# Patient Record
Sex: Male | Born: 1935 | Race: Black or African American | Hispanic: No | Smoking: Never smoker
Health system: Southern US, Community
[De-identification: ages and names within clinical notes are randomized; demographics above are authoritative.]

## PROBLEM LIST (undated history)

## (undated) DIAGNOSIS — A809 Acute poliomyelitis, unspecified: Secondary | ICD-10-CM

## (undated) DIAGNOSIS — N400 Enlarged prostate without lower urinary tract symptoms: Secondary | ICD-10-CM

## (undated) DIAGNOSIS — I10 Essential (primary) hypertension: Secondary | ICD-10-CM

## (undated) DIAGNOSIS — E785 Hyperlipidemia, unspecified: Secondary | ICD-10-CM

## (undated) HISTORY — PX: APPENDECTOMY: SHX54

## (undated) HISTORY — PX: TONSILLECTOMY: SUR1361

## (undated) HISTORY — PX: CHOLECYSTECTOMY: SHX55

---

## 2015-06-01 ENCOUNTER — Inpatient Hospital Stay (HOSPITAL_COMMUNITY): Payer: Medicare Other

## 2015-06-01 ENCOUNTER — Inpatient Hospital Stay (HOSPITAL_BASED_OUTPATIENT_CLINIC_OR_DEPARTMENT_OTHER)
Admission: EM | Admit: 2015-06-01 | Discharge: 2015-06-03 | DRG: 065 | Disposition: A | Discharge status: Home / Self Care | Payer: Medicare Other | Attending: Internal Medicine | Admitting: Internal Medicine

## 2015-06-01 ENCOUNTER — Emergency Department (HOSPITAL_COMMUNITY): Payer: Medicare Other

## 2015-06-01 ENCOUNTER — Encounter (HOSPITAL_BASED_OUTPATIENT_CLINIC_OR_DEPARTMENT_OTHER): Payer: Self-pay | Admitting: Emergency Medicine

## 2015-06-01 ENCOUNTER — Emergency Department (HOSPITAL_BASED_OUTPATIENT_CLINIC_OR_DEPARTMENT_OTHER): Payer: Medicare Other

## 2015-06-01 DIAGNOSIS — I10 Essential (primary) hypertension: Secondary | ICD-10-CM | POA: Diagnosis present

## 2015-06-01 DIAGNOSIS — E860 Dehydration: Secondary | ICD-10-CM | POA: Diagnosis present

## 2015-06-01 DIAGNOSIS — R269 Unspecified abnormalities of gait and mobility: Secondary | ICD-10-CM | POA: Diagnosis present

## 2015-06-01 DIAGNOSIS — N400 Enlarged prostate without lower urinary tract symptoms: Secondary | ICD-10-CM | POA: Diagnosis present

## 2015-06-01 DIAGNOSIS — E785 Hyperlipidemia, unspecified: Secondary | ICD-10-CM | POA: Diagnosis present

## 2015-06-01 DIAGNOSIS — N179 Acute kidney failure, unspecified: Secondary | ICD-10-CM | POA: Diagnosis present

## 2015-06-01 DIAGNOSIS — E876 Hypokalemia: Secondary | ICD-10-CM | POA: Diagnosis present

## 2015-06-01 DIAGNOSIS — I639 Cerebral infarction, unspecified: Principal | ICD-10-CM | POA: Diagnosis present

## 2015-06-01 DIAGNOSIS — Z79899 Other long term (current) drug therapy: Secondary | ICD-10-CM | POA: Diagnosis not present

## 2015-06-01 DIAGNOSIS — I638 Other cerebral infarction: Secondary | ICD-10-CM | POA: Diagnosis not present

## 2015-06-01 DIAGNOSIS — Z8612 Personal history of poliomyelitis: Secondary | ICD-10-CM

## 2015-06-01 DIAGNOSIS — H532 Diplopia: Secondary | ICD-10-CM | POA: Diagnosis present

## 2015-06-01 DIAGNOSIS — R42 Dizziness and giddiness: Secondary | ICD-10-CM | POA: Diagnosis not present

## 2015-06-01 HISTORY — DX: Hyperlipidemia, unspecified: E78.5

## 2015-06-01 HISTORY — DX: Benign prostatic hyperplasia without lower urinary tract symptoms: N40.0

## 2015-06-01 HISTORY — DX: Essential (primary) hypertension: I10

## 2015-06-01 HISTORY — DX: Acute poliomyelitis, unspecified: A80.9

## 2015-06-01 LAB — URINALYSIS, ROUTINE W REFLEX MICROSCOPIC
Bilirubin Urine: NEGATIVE
GLUCOSE, UA: NEGATIVE mg/dL
HGB URINE DIPSTICK: NEGATIVE
Ketones, ur: NEGATIVE mg/dL
Leukocytes, UA: NEGATIVE
Nitrite: NEGATIVE
Protein, ur: NEGATIVE mg/dL
Specific Gravity, Urine: 1.016 (ref 1.005–1.030)
pH: 7.5 (ref 5.0–8.0)

## 2015-06-01 LAB — BASIC METABOLIC PANEL
ANION GAP: 4 — AB (ref 5–15)
BUN: 25 mg/dL — ABNORMAL HIGH (ref 6–20)
CO2: 26 mmol/L (ref 22–32)
Calcium: 8.7 mg/dL — ABNORMAL LOW (ref 8.9–10.3)
Chloride: 107 mmol/L (ref 101–111)
Creatinine, Ser: 1.28 mg/dL — ABNORMAL HIGH (ref 0.61–1.24)
GFR calc Af Amer: 58 mL/min — ABNORMAL LOW (ref >60)
GFR, EST NON AFRICAN AMERICAN: 50 mL/min — AB (ref >60)
Glucose, Bld: 149 mg/dL — ABNORMAL HIGH (ref 65–99)
POTASSIUM: 3.2 mmol/L — AB (ref 3.5–5.1)
SODIUM: 137 mmol/L (ref 135–145)

## 2015-06-01 LAB — CBC
HCT: 41.9 % (ref 39.0–52.0)
Hemoglobin: 14.5 g/dL (ref 13.0–17.0)
MCH: 31.2 pg (ref 26.0–34.0)
MCHC: 34.6 g/dL (ref 30.0–36.0)
MCV: 90.1 fL (ref 78.0–100.0)
PLATELETS: 164 10*3/uL (ref 150–400)
RBC: 4.65 MIL/uL (ref 4.22–5.81)
RDW: 14.6 % (ref 11.5–15.5)
WBC: 5.6 10*3/uL (ref 4.0–10.5)

## 2015-06-01 LAB — DIFFERENTIAL
BASOS PCT: 0 %
Basophils Absolute: 0 10*3/uL (ref 0.0–0.1)
EOS ABS: 0 10*3/uL (ref 0.0–0.7)
EOS PCT: 1 %
LYMPHS ABS: 0.7 10*3/uL (ref 0.7–4.0)
Lymphocytes Relative: 12 %
Monocytes Absolute: 0.4 10*3/uL (ref 0.1–1.0)
Monocytes Relative: 7 %
Neutro Abs: 4.4 10*3/uL (ref 1.7–7.7)
Neutrophils Relative %: 80 %

## 2015-06-01 LAB — LIPID PANEL
CHOL/HDL RATIO: 2.6 ratio
CHOLESTEROL: 176 mg/dL (ref 0–200)
HDL: 67 mg/dL (ref >40)
LDL CALC: 95 mg/dL (ref 0–99)
TRIGLYCERIDES: 72 mg/dL (ref <150)
VLDL: 14 mg/dL (ref 0–40)

## 2015-06-01 LAB — RAPID URINE DRUG SCREEN, HOSP PERFORMED
Amphetamines: NOT DETECTED
BENZODIAZEPINES: NOT DETECTED
Barbiturates: NOT DETECTED
COCAINE: NOT DETECTED
Opiates: NOT DETECTED
Tetrahydrocannabinol: NOT DETECTED

## 2015-06-01 LAB — CBG MONITORING, ED: Glucose-Capillary: 135 mg/dL — ABNORMAL HIGH (ref 65–99)

## 2015-06-01 LAB — ETHANOL

## 2015-06-01 LAB — TROPONIN I: Troponin I: <0.03 ng/mL (ref <0.031)

## 2015-06-01 MED ORDER — ATORVASTATIN CALCIUM 80 MG PO TABS
80.0000 mg | ORAL_TABLET | Freq: Every day | ORAL | Status: DC
  Administered 2015-06-02: 80 mg via ORAL
  Filled 2015-06-01: qty 1

## 2015-06-01 MED ORDER — POTASSIUM CHLORIDE CRYS ER 20 MEQ PO TBCR
40.0000 meq | EXTENDED_RELEASE_TABLET | Freq: Once | ORAL | Status: AC
  Administered 2015-06-02: 40 meq via ORAL
  Filled 2015-06-01: qty 2

## 2015-06-01 MED ORDER — CLONIDINE HCL 0.1 MG PO TABS
0.1000 mg | ORAL_TABLET | Freq: Once | ORAL | Status: AC
  Administered 2015-06-01: 0.1 mg via ORAL
  Filled 2015-06-01: qty 1

## 2015-06-01 MED ORDER — ENOXAPARIN SODIUM 40 MG/0.4ML ~~LOC~~ SOLN
40.0000 mg | SUBCUTANEOUS | Status: DC
  Administered 2015-06-01 – 2015-06-02 (×2): 40 mg via SUBCUTANEOUS
  Filled 2015-06-01 (×2): qty 0.4

## 2015-06-01 MED ORDER — ASPIRIN 325 MG PO TABS: 325.0000 mg | ORAL_TABLET | Freq: Every day | ORAL | Status: DC

## 2015-06-01 MED ORDER — ASPIRIN 81 MG PO TBEC: 81.0000 mg | DELAYED_RELEASE_TABLET | Freq: Every day | ORAL | Status: DC

## 2015-06-01 MED ORDER — IOPAMIDOL (ISOVUE-370) INJECTION 76%
INTRAVENOUS | Status: AC
  Administered 2015-06-01: 50 mL
  Filled 2015-06-01: qty 50

## 2015-06-01 MED ORDER — LORAZEPAM 2 MG/ML IJ SOLN
1.0000 mg | Freq: Once | INTRAMUSCULAR | Status: AC
  Administered 2015-06-01: 1 mg via INTRAVENOUS
  Filled 2015-06-01: qty 1

## 2015-06-01 MED ORDER — SODIUM CHLORIDE 0.9% FLUSH
3.0000 mL | Freq: Two times a day (BID) | INTRAVENOUS | Status: DC
  Administered 2015-06-01 – 2015-06-03 (×3): 3 mL via INTRAVENOUS

## 2015-06-01 NOTE — ED Notes (Signed)
Patient transported to MRI 

## 2015-06-01 NOTE — Progress Notes (Signed)
MD notified about pt continuously having high BP.  MD stated BP can go up to 220/120.  Will continue to monitor.  Estanislado EmmsAshley Schwarz, RN

## 2015-06-01 NOTE — ED Notes (Signed)
Attempted report, RN on 61M made aware of patient's continued hypertension while being in ED and that patient has no orders for meds for elevated BP. RN on floor requested this RN check a manual pressure and contact MD caring for patient for further orders before bringing patient up.

## 2015-06-01 NOTE — ED Notes (Addendum)
This RN spoke with Dr. Beckie Saltsivet who was made aware of patient's bp systolically in the 200's, Dr. Beckie Saltsivet advised patient okay to go to 97M with current BP and that BP does not need treatment unless above 220/120. Cuida, RN on floor made aware.

## 2015-06-01 NOTE — ED Notes (Signed)
Pt reports that at 0600 he awoke  This am and got up to go to his meeting and drank grapefruit juice and started vomiting, he reports vomiting x 2, he started to got to meeting and noticed that he was seeing 2 cars and 2 lines on road instead of 1, [place]. He also reports feeling lightheaded, continued nausea

## 2015-06-01 NOTE — Consult Note (Addendum)
Requesting Physician: Dr. Dalene SeltzerSchlossman    Chief Complaint: stroke   HPI:                                                                                                                                         Theodore NunneryFrancis K Cerda Jr. is an 82 y.o. male who was showering at 5 AM. He states he was up early because he was going to be driving to this area. While in the shower, he reports he felt dizzy and not very well. He did become nauseated and did have emesis. He still continued to drive. At about 6:15 he noted diplopia and noted if he closed one eye it would resolve. Due to persistent diplopia he came to ED . While in ED he had a MRI which was positive for CVA. Currently he continues to have diplopia --more when looking out in distance and resolves with near vision.   Date last known well: Today Time last known well: Time: 05:00 tPA Given: No: out of window    Past Medical History  Diagnosis Date  . Hypertension   . Hyperlipidemia     Past Surgical History  Procedure Laterality Date  . Tonsillectomy    . Appendectomy      Family History  Problem Relation Age of Onset  . Hyperlipidemia Mother   . Hypertension Mother    Social History:  reports that he has never smoked. He does not have any smokeless tobacco history on file. He reports that he does not drink alcohol. His drug history is not on file.  Allergies: No Known Allergies  Medications:                                                                                                                           No current facility-administered medications for this encounter.   Current Outpatient Prescriptions  Medication Sig Dispense Refill  . cloNIDine (CATAPRES) 0.1 MG tablet Take 0.1 mg by mouth 2 (two) times daily.    . finasteride (PROSCAR) 5 MG tablet Take 5 mg by mouth daily.    . furosemide (LASIX) 20 MG tablet Take 20 mg by mouth.    . olmesartan (BENICAR) 40 MG tablet Take 40 mg by mouth daily.    . prazosin  (MINIPRESS) 2 MG capsule Take 2 mg by mouth at bedtime.    .Marland Kitchen  tamsulosin (FLOMAX) 0.4 MG CAPS capsule Take 0.4 mg by mouth.       ROS:                                                                                                                                       History obtained from the patient  General ROS: negative for - chills, fatigue, fever, night sweats, weight gain or weight loss Psychological ROS: negative for - behavioral disorder, hallucinations, memory difficulties, mood swings or suicidal ideation Ophthalmic ROS: negative for - blurry vision, double vision, eye pain or loss of vision ENT ROS: negative for - epistaxis, nasal discharge, oral lesions, sore throat, tinnitus or vertigo Allergy and Immunology ROS: negative for - hives or itchy/watery eyes Hematological and Lymphatic ROS: negative for - bleeding problems, bruising or swollen lymph nodes Endocrine ROS: negative for - galactorrhea, hair pattern changes, polydipsia/polyuria or temperature intolerance Respiratory ROS: negative for - cough, hemoptysis, shortness of breath or wheezing Cardiovascular ROS: negative for - chest pain, dyspnea on exertion, edema or irregular heartbeat Gastrointestinal ROS: negative for - abdominal pain, diarrhea, hematemesis, nausea/vomiting or stool incontinence Genito-Urinary ROS: negative for - dysuria, hematuria, incontinence or urinary frequency/urgency Musculoskeletal ROS: negative for - joint swelling or muscular weakness Neurological ROS: as noted in HPI Dermatological ROS: negative for rash and skin lesion changes  Neurologic Examination:                                                                                                      Blood pressure 186/95, pulse 61, temperature 98.1 F (36.7 C), temperature source Oral, resp. rate 16, weight 90.719 kg (200 lb), SpO2 97 %.  HEENT-  Normocephalic, no lesions, without obvious abnormality.  Normal external eye and conjunctiva.   Normal TM's bilaterally.  Normal auditory canals and external ears. Normal external nose, mucus membranes and septum.  Normal pharynx. Cardiovascular- S1, S2 normal, pulses palpable throughout   Lungs- chest clear, no wheezing, rales, normal symmetric air entry Abdomen- normal findings: bowel sounds normal Extremities- no edema Lymph-no adenopathy palpable Musculoskeletal-no joint tenderness, deformity or swelling Skin-warm and dry, no hyperpigmentation, vitiligo, or suspicious lesions  Neurological Examination Mental Status: Alert, oriented, thought content appropriate.  Speech fluent without evidence of aphasia.  Able to follow 3 step commands without difficulty. Cranial Nerves: II:  Visual fields grossly normal, pupils equal, round, reactive to light and accommodation III,IV, VI: ptosis not present, extra-ocular motions intact bilaterally--when looking distally his eyes are slightly disconjugate  V,VII: smile symmetric, facial light touch sensation normal bilaterally VIII: hearing normal bilaterally IX,X: uvula rises symmetrically XI: bilateral shoulder shrug XII: midline tongue extension Motor: Right : Upper extremity   5/5    Left:     Upper extremity   5/5  Lower extremity   1/5     Lower extremity   5/5 Right leg held in flexion contracture due to Polo --orbits right hand around left.  Tone and bulk:normal tone throughout; no atrophy noted Sensory: Pinprick and light touch intact throughout, bilaterally Deep Tendon Reflexes: 1+ and symmetric throughout Plantars: Right: downgoing   Left: downgoing Cerebellar: normal finger-to-nose and normal heel-to-shin test Gait: not tested       Lab Results: Basic Metabolic Panel:  Recent Labs Lab 06/01/15 0936  NA 137  K 3.2*  CL 107  CO2 26  GLUCOSE 149*  BUN 25*  CREATININE 1.28*  CALCIUM 8.7*    Liver Function Tests: No results for input(s): AST, ALT, ALKPHOS, BILITOT, PROT, ALBUMIN in the last 168 hours. No results  for input(s): LIPASE, AMYLASE in the last 168 hours. No results for input(s): AMMONIA in the last 168 hours.  CBC:  Recent Labs Lab 06/01/15 0936  WBC 5.6  NEUTROABS 4.4  HGB 14.5  HCT 41.9  MCV 90.1  PLT 164    Cardiac Enzymes:  Recent Labs Lab 06/01/15 0936  TROPONINI <0.03    Lipid Panel: No results for input(s): CHOL, TRIG, HDL, CHOLHDL, VLDL, LDLCALC in the last 168 hours.  CBG:  Recent Labs Lab 06/01/15 0927  GLUCAP 135*    Microbiology: No results found for this or any previous visit.  Coagulation Studies: No results for input(s): LABPROT, INR in the last 72 hours.  Imaging: Ct Head Wo Contrast  06/01/2015  CLINICAL DATA:  Diplopia and nausea since this morning. EXAM: CT HEAD WITHOUT CONTRAST TECHNIQUE: Contiguous axial images were obtained from the base of the skull through the vertex without intravenous contrast. COMPARISON:  None. FINDINGS: No mass lesion. No midline shift. No acute hemorrhage or hematoma. No extra-axial fluid collections. No evidence of acute infarction. Diffuse slight cerebellar atrophy and cerebral cortical atrophy. Brain parenchyma is otherwise normal. No bone abnormality. Visualized portion of the orbits is normal. IMPRESSION: No acute abnormality.  Atrophy as described. Electronically Signed   By: Francene Boyers M.D.   On: 06/01/2015 10:31   Mr Brain Wo Contrast  06/01/2015  CLINICAL DATA:  Diplopia and nausea beginning this morning. EXAM: MRI HEAD WITHOUT CONTRAST TECHNIQUE: Multiplanar, multiecho pulse sequences of the brain and surrounding structures were obtained without intravenous contrast. COMPARISON:  Head CT 06/01/2015 FINDINGS: There is a punctate 3 mm acute infarct in the posterior right pons. There is no evidence of intracranial hemorrhage, mass, midline shift, or extra-axial fluid collection. There is mild cerebral and cerebellar atrophy. T2 hyperintensities in the subcortical and periventricular cerebral white matter are  compatible with mild chronic small vessel ischemic disease. There is a chronic left central pontine infarct. Orbits are unremarkable. A right maxillary sinus mucous retention cyst is noted. The mastoid air cells are clear. Major intracranial vascular flow voids are preserved. IMPRESSION: 1. Punctate acute right pontine infarct. 2. Chronic left pontine infarct and chronic small vessel ischemic change in the cerebral white matter. Electronically Signed   By: Sebastian Ache M.D.   On: 06/01/2015 15:09       Assessment and plan discussed with with attending physician and they are in agreement.    Onalee Hua  [place] [place] [phone]  06/01/2015, 4:06 PM   Assessment: 82 y.o. male with acute right Pontine CVA. Likely due to small vessel disease.  Stroke Risk Factors - hyperlipidemia and hypertension  1. HgbA1c, fasting lipid panel 2. MRI of the brain without contrast 3. Frequent neuro checks 4. Echocardiogram 5. CT angiogram of the head and neck 6. Prophylactic therapy-Antiplatelet med: Aspirin - dose  PO or  PR 7. Risk factor modification 8. Telemetry monitoring 9. PT consult, OT consult, Speech consult 10. please page stroke NP  Or  PA  Or MD  M-F from 8am -4 pm starting 4/29 as this patient will be followed by the stroke team at this point.   You can look them up on [email or web address]   11. Permissive hypertension to 220/120   Ritta Slot, MD [facility] [phone]  If 7pm- 7am, please page neurology on call as listed in AMION.

## 2015-06-01 NOTE — ED Notes (Signed)
Dr. Amada JupiterKirkpatrick at bedside, aware of 5M's concern over patient's blood pressure, MD stated that patient's bp should be no higher than 220/120, MD made aware that patient's bp is 160/100, MD advised this RN to let floor know that he is okay with patient's blood pressure and that he does not want to treat it at this time.

## 2015-06-01 NOTE — Progress Notes (Signed)
Pt arrived to 5M16 room from ED.  Pt is alert and oriented. Pt states he is still having double vision, but denies numbness or tingling.  Safety measures in place.  Pt daughter, Darcella Gasmanonya Stall, called to check on pt.  Daughter will like MD to call tomorrow with updates about pt and test results.  Archie Pattenonya phone #[phone].  Festus Aloeia Harris is also pt daughter, phone # is [phone].  Will continue to monitor.   Estanislado EmmsAshley Schwarz, RN

## 2015-06-01 NOTE — H&P (Signed)
Date: 06/01/2015               Patient Name:  Theodore Tanner. MRN: [id number]  DOB: 09/29/1930 Age / Sex: 82 y.o., male   PCP: Pcp Not In System         Medical Service: Internal Medicine Teaching Service         Attending Physician: Dr. Earl Lagos, MD    First Contact: Dr. Loney Loh Pager: [phone]  Second Contact: Dr. Beckie Salts Pager: [phone]       After Hours (After 5p/  First Contact Pager: [phone]  weekends / holidays): Second Contact Pager: [phone]   Chief Complaint: "seeing double"  History of Present Illness: Patient is a 82 year old male with a past medical history of hypertension, hyperlipidemia, BPH, and polio (right lower extremity weakness) presenting to the hospital with a chief complaint of "seeing double" when he was driving earlier today. Patient reports having diplopia when using both of his eyes to see and states diplopia resolves when he only uses one eye. States he was on the highway when this happened and he kept on driving for 2 hours. When he finally reached his hotel he felt nauseated and vomited. Also reports feeling "woozy" and having problems with balance at that time. Denies having any focal weakness, numbness, or tingling. States this has never happened to him before and he was in his normal state of health until this morning. Denies having any prior history of seizures, stroke, or any other neurological disorder. Denies losing consciousness or having any speech difficulty.  Meds: Current Facility-Administered Medications  Medication Dose Route Frequency Provider Last Rate Last Dose  . [START ON 06/02/2015] aspirin EC tablet 81 mg  81 mg Oral Daily Carly J Rivet, MD      . aspirin tablet 325 mg  325 mg Oral Daily Su Hoff, MD      . Melene Muller ON 06/02/2015] atorvastatin (LIPITOR) tablet 80 mg  80 mg Oral q1800 Carly J Rivet, MD      . enoxaparin (LOVENOX) injection 40 mg  40 mg Subcutaneous Q24H Carly J Rivet, MD      . potassium chloride SA  (K-DUR,KLOR-CON) CR tablet 40 mEq  40 mEq Oral Once Carly J Rivet, MD      . sodium chloride flush (NS) 0.9 % injection 3 mL  3 mL Intravenous Q12H Carly Arlyce Harman, MD        Allergies: Allergies as of 06/01/2015  . (No Known Allergies)   Past Medical History  Diagnosis Date  . Hypertension   . Hyperlipidemia   . BPH (benign prostatic hyperplasia)   . Polio     RLE weakness   Past Surgical History  Procedure Laterality Date  . Tonsillectomy    . Appendectomy    . Cholecystectomy     Family History  Problem Relation Age of Onset  . Hyperlipidemia Mother   . Hypertension Mother   . Cancer Father     "Intestines", lungs  . Cancer Mother     Lung  . Diabetes Sister    Social History   Social History  . Marital Status: Widowed    Spouse Name: N/A  . Number of Children: N/A  . Years of Education: N/A   Occupational History  . Not on file.   Social History Main Topics  . Smoking status: Never Smoker   . Smokeless tobacco: Not on file  . Alcohol Use: No  . Drug Use: No  .  Sexual Activity: Not on file   Other Topics Concern  . Not on file   Social History Narrative    Review of Systems: Review of Systems  Constitutional: Negative for fever, chills and malaise/fatigue.  HENT: Negative for congestion and sore throat.   Eyes: Negative for pain and discharge.       Diplopia   Respiratory: Negative for cough, shortness of breath and wheezing.   Cardiovascular: Negative for chest pain, palpitations and leg swelling.  Gastrointestinal: Positive for nausea and vomiting. Negative for abdominal pain, diarrhea and constipation.  Genitourinary: Negative for dysuria.  Musculoskeletal: Negative for myalgias and back pain.  Skin: Negative for itching and rash.  Neurological: Positive for dizziness. Negative for sensory change, focal weakness and headaches.       Unsteady gait     Physical Exam: Blood pressure 223/91, pulse 59, temperature 98.6 F (37 C), temperature  source Oral, resp. rate 20, weight 200 lb (90.719 kg), SpO2 100 %. Physical Exam  Constitutional: He is oriented to person, place, and time. He appears well-developed and well-nourished. No distress.  HENT:  Head: Normocephalic and atraumatic.  Mouth/Throat: Oropharynx is clear and moist.  Eyes: EOM are normal. Pupils are equal, round, and reactive to light.  Neck: Neck supple. No tracheal deviation present.  Cardiovascular: Normal rate, regular rhythm and intact distal pulses.  Exam reveals no gallop and no friction rub.   No murmur heard. Pulmonary/Chest: Effort normal and breath sounds normal. No respiratory distress. He has no wheezes. He has no rales.  Abdominal: Soft. Bowel sounds are normal. He exhibits no distension. There is no tenderness.  Musculoskeletal: He exhibits no edema.  Neurological: He is alert and oriented to person, place, and time. No cranial nerve deficit. Coordination normal.  Strength 5/5 and sensation intact to light touch in bilateral upper and lower extremities.  Patient is able to see clearly when covering one eye at a time, however, has diplopia when engaging both his eyes.   Skin: Skin is warm and dry. No rash noted. He is not diaphoretic. No erythema.    Lab results: Basic Metabolic Panel:  Recent Labs  16/11/9602/28/17 0936  NA 137  K 3.2*  CL 107  CO2 26  GLUCOSE 149*  BUN 25*  CREATININE 1.28*  CALCIUM 8.7*   Liver Function Tests: No results for input(s): AST, ALT, ALKPHOS, BILITOT, PROT, ALBUMIN in the last 72 hours. No results for input(s): LIPASE, AMYLASE in the last 72 hours. No results for input(s): AMMONIA in the last 72 hours. CBC:  Recent Labs  06/01/15 0936  WBC 5.6  NEUTROABS 4.4  HGB 14.5  HCT 41.9  MCV 90.1  PLT 164   Cardiac Enzymes:  Recent Labs  06/01/15 0936  TROPONINI <0.03   BNP: No results for input(s): PROBNP in the last 72 hours. D-Dimer: No results for input(s): DDIMER in the last 72 hours. CBG:  Recent  Labs  06/01/15 0927  GLUCAP 135*   Hemoglobin A1C: No results for input(s): HGBA1C in the last 72 hours. Fasting Lipid Panel: No results for input(s): CHOL, HDL, LDLCALC, TRIG, CHOLHDL, LDLDIRECT in the last 72 hours. Thyroid Function Tests: No results for input(s): TSH, T4TOTAL, FREET4, T3FREE, THYROIDAB in the last 72 hours. Anemia Panel: No results for input(s): VITAMINB12, FOLATE, FERRITIN, TIBC, IRON, RETICCTPCT in the last 72 hours. Coagulation: No results for input(s): LABPROT, INR in the last 72 hours. Urine Drug Screen: Drugs of Abuse  No results found for: LABOPIA, COCAINSCRNUR,  LABBENZ, AMPHETMU, THCU, LABBARB  Alcohol Level:  Recent Labs  06/01/15 0927  ETH <5   Urinalysis: No results for input(s): COLORURINE, LABSPEC, PHURINE, GLUCOSEU, HGBUR, BILIRUBINUR, KETONESUR, PROTEINUR, UROBILINOGEN, NITRITE, LEUKOCYTESUR in the last 72 hours.  Invalid input(s): APPERANCEUR Misc. Labs:   Imaging results:  Ct Head Wo Contrast  06/01/2015  CLINICAL DATA:  Diplopia and nausea since this morning. EXAM: CT HEAD WITHOUT CONTRAST TECHNIQUE: Contiguous axial images were obtained from the base of the skull through the vertex without intravenous contrast. COMPARISON:  None. FINDINGS: No mass lesion. No midline shift. No acute hemorrhage or hematoma. No extra-axial fluid collections. No evidence of acute infarction. Diffuse slight cerebellar atrophy and cerebral cortical atrophy. Brain parenchyma is otherwise normal. No bone abnormality. Visualized portion of the orbits is normal. IMPRESSION: No acute abnormality.  Atrophy as described. Electronically Signed   By: Francene Boyers M.D.   On: 06/01/2015 10:31   Mr Brain Wo Contrast  06/01/2015  CLINICAL DATA:  Diplopia and nausea beginning this morning. EXAM: MRI HEAD WITHOUT CONTRAST TECHNIQUE: Multiplanar, multiecho pulse sequences of the brain and surrounding structures were obtained without intravenous contrast. COMPARISON:  Head CT  06/01/2015 FINDINGS: There is a punctate 3 mm acute infarct in the posterior right pons. There is no evidence of intracranial hemorrhage, mass, midline shift, or extra-axial fluid collection. There is mild cerebral and cerebellar atrophy. T2 hyperintensities in the subcortical and periventricular cerebral white matter are compatible with mild chronic small vessel ischemic disease. There is a chronic left central pontine infarct. Orbits are unremarkable. A right maxillary sinus mucous retention cyst is noted. The mastoid air cells are clear. Major intracranial vascular flow voids are preserved. IMPRESSION: 1. Punctate acute right pontine infarct. 2. Chronic left pontine infarct and chronic small vessel ischemic change in the cerebral white matter. Electronically Signed   By: Sebastian Ache M.D.   On: 06/01/2015 15:09    Other results: EKG: sinus rhythm, PVCs, PR prolongation   Assessment & Plan by Problem: Active Problems:   Acute ischemic stroke (HCC)  Acute right pontine infarct Patient is presenting with acute onset diplopia, dizzines, and gait instability. No prior history of stroke. CT of head was negative. MRI showing an acute R pontine infarct, chronic L pontine infarct, and small vessel ischemic change in the cerebral white matter.  -Neurology is on board, appreciate recs -Admit to tele -Aspirin 325 mg daily  -Lipitor 80 mg daily  -Frequent neuro checks  -CTA of head and neck pending -Hgb A1c pending -Echo pending -PT/OT/ speech consult   Elevated serum creatinine Cr 1.2, no baseline to compare. Likely pre-renal due to dehydration from vomiting.  -Repeat BMP in am   Hypokalemia K 3.2 on admission and repleted. -BMP in am  HTN -Hold home meds to allow permissive HTN (<220/120)  DVT/ PE ppx: Lovenox   Diet: heart healthy  Code: Full (confirmed with patient)  Dispo: Disposition is deferred at this time, awaiting improvement of current medical problems. Anticipated discharge  in approximately 1-2 day(s).   The patient does have a current PCP (Pcp Not In System) and does need an [facility] hospital follow-up appointment after discharge.  The patient does not have transportation limitations that hinder transportation to clinic appointments.  Signed: John Giovanni, MD 06/01/2015, 7:35 PM

## 2015-06-01 NOTE — ED Notes (Signed)
Dr. Earlie RavelingNarenda paged to this RN to make sure MD is aware of patient's bp and ensure that MD does not want any treatment of patient's BP prior to him going to floor. This RN awaiting return phone call from MD.

## 2015-06-01 NOTE — ED Notes (Signed)
PA Katrinka BlazingSmith advised patient may eat since he has passed his swallow screen.

## 2015-06-01 NOTE — ED Notes (Signed)
Cuida, RN on 67M aware of Dr. Chipper OmanKirkpatricks response regarding patient's bp and that he does not want to treat bp as long as it is under 220/120. RN on 67M acknowledged this and advised this RN that patient may come up at this time.

## 2015-06-01 NOTE — ED Notes (Signed)
PA Felicie Mornavid Smith with Neurology at bedside

## 2015-06-01 NOTE — ED Provider Notes (Signed)
CSN: [id number]     Arrival date & time 06/01/15  [id number] History   First MD Initiated Contact with Patient 06/01/15 [phone]     Chief Complaint  Patient presents with  . Diplopia     (Consider location/radiation/quality/duration/timing/severity/associated sxs/prior Treatment) HPI Patient reports that he was showering at 5 AM. He states he was up early because he was going to be driving to this area. While in the shower, he reports he felt dizzy and not very well. He does not specifically endorse a vertiginous type of dizziness. He denies associated headache. He reports he got nauseated and vomited a little bit at that time and then again after getting out of the shower he continues to feel nauseated and had some dry heaves. The dizziness persisted. It did not interfere with his gait. He denies it was focal weakness numbness tingling or extremity dysfunction. He also denies headache. He reports he got in his car started driving, once he got on the freeway which she reports was approximately 6:15 AM, he started seeing 2 of everything. Through trial and error he noted the double vision resolved when he closed either eye. He denies there was loss of vision. This double vision has persisted. He denies ever having had similar symptoms. He denies history of stroke or heart attack. He does report he was diagnosed with a blood clot in his right eye and had an injection by ophthalmology at [facility] several months ago. He reports he does have hypertension. He reports he is compliant with medications but has not taken his medications today. Past Medical History  Diagnosis Date  . Hypertension   . Hyperlipidemia    Past Surgical History  Procedure Laterality Date  . Tonsillectomy    . Appendectomy     History reviewed. No pertinent family history. Social History  Substance Use Topics  . Smoking status: Never Smoker   . Smokeless tobacco: None  . Alcohol Use: No    Review of Systems 10 Systems  reviewed and are negative for acute change except as noted in the HPI.    Allergies  Review of patient's allergies indicates no known allergies.  Home Medications   Prior to Admission medications   Medication Sig Start Date End Date Taking? Authorizing Provider  cloNIDine (CATAPRES) 0.1 MG tablet Take 0.1 mg by mouth 2 (two) times daily.   Yes Historical Provider, MD  finasteride (PROSCAR) 5 MG tablet Take 5 mg by mouth daily.   Yes Historical Provider, MD  furosemide (LASIX) 20 MG tablet Take 20 mg by mouth.   Yes Historical Provider, MD  olmesartan (BENICAR) 40 MG tablet Take 40 mg by mouth daily.   Yes Historical Provider, MD  prazosin (MINIPRESS) 2 MG capsule Take 2 mg by mouth at bedtime.   Yes Historical Provider, MD  tamsulosin (FLOMAX) 0.4 MG CAPS capsule Take 0.4 mg by mouth.   Yes Historical Provider, MD   BP 189/88 mmHg  Pulse 56  Temp(Src) 97.8 F (36.6 C) (Oral)  Resp 14  Wt 200 lb (90.719 kg)  SpO2 98% Physical Exam  Constitutional: He is oriented to person, place, and time. He appears well-developed and well-nourished. No distress.  HENT:  Head: Normocephalic and atraumatic.  Right Ear: External ear normal.  Left Ear: External ear normal.  Nose: Nose normal.  Mouth/Throat: Oropharynx is clear and moist.  Eyes: Pupils are equal, round, and reactive to light.  Neck: Neck supple.  Cardiovascular: Normal rate, regular rhythm, normal heart sounds  and intact distal pulses.   Pulmonary/Chest: Effort normal and breath sounds normal.  Abdominal: Soft. Bowel sounds are normal. He exhibits no distension. There is no tenderness.  Musculoskeletal: Normal range of motion. He exhibits no edema.  Neurological: He is alert and oriented to person, place, and time. He has normal strength. Coordination normal. GCS eye subscore is 4. GCS verbal subscore is 5. GCS motor subscore is 6.  Skin: Skin is warm, dry and intact.  Psychiatric: He has a normal mood and affect.    ED Course   Procedures (including critical care time) Labs Review Labs Reviewed  BASIC METABOLIC PANEL - Abnormal; Notable for the following:    Potassium 3.2 (*)    Glucose, Bld 149 (*)    BUN 25 (*)    Creatinine, Ser 1.28 (*)    Calcium 8.7 (*)    GFR calc non Af Amer 50 (*)    GFR calc Af Amer 58 (*)    Anion gap 4 (*)    All other components within normal limits  CBG MONITORING, ED - Abnormal; Notable for the following:    Glucose-Capillary 135 (*)    All other components within normal limits  CBC  DIFFERENTIAL  ETHANOL  PROTIME-INR  APTT  URINE RAPID DRUG SCREEN, HOSP PERFORMED  URINALYSIS, ROUTINE W REFLEX MICROSCOPIC (NOT AT [facility])  TROPONIN I  CBG MONITORING, ED    Imaging Review Ct Head Wo Contrast  06/01/2015  CLINICAL DATA:  Diplopia and nausea since this morning. EXAM: CT HEAD WITHOUT CONTRAST TECHNIQUE: Contiguous axial images were obtained from the base of the skull through the vertex without intravenous contrast. COMPARISON:  None. FINDINGS: No mass lesion. No midline shift. No acute hemorrhage or hematoma. No extra-axial fluid collections. No evidence of acute infarction. Diffuse slight cerebellar atrophy and cerebral cortical atrophy. Brain parenchyma is otherwise normal. No bone abnormality. Visualized portion of the orbits is normal. IMPRESSION: No acute abnormality.  Atrophy as described. Electronically Signed   By: Francene Boyers M.D.   On: 06/01/2015 10:31   I have personally reviewed and evaluated these images and lab results as part of my medical decision-making.   EKG Interpretation   Date/Time:  Friday June 01 2015 09:27:53 EDT Ventricular Rate:  69 PR Interval:  221 QRS Duration: 115 QT Interval:  410 QTC Calculation: 439 R Axis:   -41 Text Interpretation:  Sinus rhythm Multiple ventricular premature  complexes Prolonged PR interval LVH with IVCD, LAD and secondary repol  abnrm Confirmed by Donnald Garre, MD, Lebron Conners [phone]) on 06/01/2015 10:34:07 AM      Consult: Reviewed to Dr. Amada Jupiter of neurology. Patient will need MRI. Plan to transfer to [facility] emergency department for neurology consultation and MRI. Consult: Reviewed with Dr. Tyrone Apple of emergency department for EMTALA transfer. MDM   Final diagnoses:  Dizziness  Essential hypertension  Binocular vision disorder with diplopia   Patient had onset first of dizziness and nausea reportedly approximately 5 AM. Subsequently diplopia at 6:15 AM. Patient is alert and appropriate. Cognitive function is normal. There is no focal motor deficit. Patient will be transferred to [facility] for neurology consultation and MRI.    Arby Barrette, MD 06/01/15 1034

## 2015-06-01 NOTE — ED Notes (Signed)
MD at bedside. 

## 2015-06-02 DIAGNOSIS — E876 Hypokalemia: Secondary | ICD-10-CM

## 2015-06-02 DIAGNOSIS — R7989 Other specified abnormal findings of blood chemistry: Secondary | ICD-10-CM

## 2015-06-02 DIAGNOSIS — N179 Acute kidney failure, unspecified: Secondary | ICD-10-CM

## 2015-06-02 DIAGNOSIS — I639 Cerebral infarction, unspecified: Principal | ICD-10-CM

## 2015-06-02 DIAGNOSIS — I10 Essential (primary) hypertension: Secondary | ICD-10-CM

## 2015-06-02 DIAGNOSIS — I638 Other cerebral infarction: Secondary | ICD-10-CM

## 2015-06-02 LAB — BASIC METABOLIC PANEL
Anion gap: 10 (ref 5–15)
BUN: 13 mg/dL (ref 6–20)
CALCIUM: 8.7 mg/dL — AB (ref 8.9–10.3)
CO2: 28 mmol/L (ref 22–32)
CREATININE: 1.02 mg/dL (ref 0.61–1.24)
Chloride: 106 mmol/L (ref 101–111)
GFR calc non Af Amer: >60 mL/min (ref >60)
GLUCOSE: 101 mg/dL — AB (ref 65–99)
Potassium: 3.4 mmol/L — ABNORMAL LOW (ref 3.5–5.1)
Sodium: 144 mmol/L (ref 135–145)

## 2015-06-02 MED ORDER — CLOPIDOGREL BISULFATE 75 MG PO TABS
75.0000 mg | ORAL_TABLET | Freq: Every day | ORAL | Status: DC
  Administered 2015-06-03: 75 mg via ORAL
  Filled 2015-06-02: qty 1

## 2015-06-02 MED ORDER — CLOPIDOGREL BISULFATE 75 MG PO TABS: 75.0000 mg | ORAL_TABLET | Freq: Every day | ORAL | Status: DC

## 2015-06-02 MED ORDER — HYDROCODONE-ACETAMINOPHEN 5-325 MG PO TABS: 1.0000 | ORAL_TABLET | ORAL | Status: DC | PRN

## 2015-06-02 MED ORDER — HYDROCODONE-ACETAMINOPHEN 5-325 MG PO TABS
2.0000 | ORAL_TABLET | Freq: Once | ORAL | Status: DC
  Filled 2015-06-02: qty 2

## 2015-06-02 NOTE — Progress Notes (Signed)
Based on progress note by Theodosia PalingAshley M Schwartz 06/01/15 2218 we are to notify only for systolic BP over 161220 and diastolic over 120 and not to alert attending for BP's systolic over 160 or diastolic over 100 per the orders for this patient. Patients BP  06/02/15 2214 was 209/93 will continue to monitor.

## 2015-06-02 NOTE — Progress Notes (Addendum)
Subjective: No acute overnight events. Patient states his vision is better now. He still has intermittent diplopia but only when looking at objects that are far away. No nausea or vomiting. He is eating well.    Objective: Vital signs in last 24 hours: Filed Vitals:   06/02/15 0400 06/02/15 0600 06/02/15 0800 06/02/15 0937  BP: 190/93 159/80 161/115 192/94  Pulse: 56 55 70 58  Temp:   98.1 F (36.7 C) 98.4 F (36.9 C)  TempSrc:   Oral Oral  Resp: 18 18 20 18   Weight:      SpO2: 99% 99% 99% 100%   Weight change:  No intake or output data in the 24 hours ending 06/02/15 1108  Physical Exam: Constitutional: He is oriented to person, place, and time. He appears well-developed and well-nourished. No distress.   Mouth/Throat: Oropharynx is clear and moist.  Eyes: EOM are normal.   Cardiovascular: Normal rate, regular rhythm and intact distal pulses. Exam reveals no gallop and no friction rub.  No murmur heard. Pulmonary/Chest: Effort normal and breath sounds normal. No respiratory distress. He has no wheezes. He has no rales.  Abdominal: Soft. Bowel sounds are normal. He exhibits no distension. There is no tenderness.  Musculoskeletal: He exhibits no edema.  Neurological: He is alert and oriented to person, place, and time. No cranial nerve deficit. Coordination normal.  Strength 5/5 and sensation intact to light touch in bilateral upper and lower extremities.  He was able to see clearly on exam when asked to see objects nearby.    Skin: Skin is warm and dry. No rash noted. He is not diaphoretic. No erythema.   Lab Results: Basic Metabolic Panel:  Recent Labs Lab 06/01/15 0936 06/02/15 0445  NA 137 144  K 3.2* 3.4*  CL 107 106  CO2 26 28  GLUCOSE 149* 101*  BUN 25* 13  CREATININE 1.28* 1.02  CALCIUM 8.7* 8.7*   Liver Function Tests: No results for input(s): AST, ALT, ALKPHOS, BILITOT, PROT, ALBUMIN in the last 168 hours. No results for input(s): LIPASE, AMYLASE  in the last 168 hours. No results for input(s): AMMONIA in the last 168 hours. CBC:  Recent Labs Lab 06/01/15 0936  WBC 5.6  NEUTROABS 4.4  HGB 14.5  HCT 41.9  MCV 90.1  PLT 164   Cardiac Enzymes:  Recent Labs Lab 06/01/15 0936  TROPONINI <0.03   BNP: No results for input(s): PROBNP in the last 168 hours. D-Dimer: No results for input(s): DDIMER in the last 168 hours. CBG:  Recent Labs Lab 06/01/15 0927  GLUCAP 135*   Hemoglobin A1C: No results for input(s): HGBA1C in the last 168 hours. Fasting Lipid Panel:  Recent Labs Lab 06/01/15 2104  CHOL 176  HDL 67  LDLCALC 95  TRIG 72  CHOLHDL 2.6   Thyroid Function Tests: No results for input(s): TSH, T4TOTAL, FREET4, T3FREE, THYROIDAB in the last 168 hours. Coagulation: No results for input(s): LABPROT, INR in the last 168 hours. Anemia Panel: No results for input(s): VITAMINB12, FOLATE, FERRITIN, TIBC, IRON, RETICCTPCT in the last 168 hours. Urine Drug Screen: Drugs of Abuse     Component Value Date/Time   LABOPIA NONE DETECTED 06/01/2015 2121   COCAINSCRNUR NONE DETECTED 06/01/2015 2121   LABBENZ NONE DETECTED 06/01/2015 2121   AMPHETMU NONE DETECTED 06/01/2015 2121   THCU NONE DETECTED 06/01/2015 2121   LABBARB NONE DETECTED 06/01/2015 2121    Alcohol Level:  Recent Labs Lab 06/01/15 0927  ETH <5  Urinalysis:  Recent Labs Lab 06/01/15 2122  COLORURINE YELLOW  LABSPEC 1.016  PHURINE 7.5  GLUCOSEU NEGATIVE  HGBUR NEGATIVE  BILIRUBINUR NEGATIVE  KETONESUR NEGATIVE  PROTEINUR NEGATIVE  NITRITE NEGATIVE  LEUKOCYTESUR NEGATIVE   Misc. Labs:   Micro Results: No results found for this or any previous visit (from the past 240 hour(s)). Studies/Results: Ct Angio Head W/cm &/or Wo Cm  06/01/2015  CLINICAL DATA:  Stroke.  History of hypertension and hyperlipidemia. EXAM: CT ANGIOGRAPHY HEAD AND NECK TECHNIQUE: Multidetector CT imaging of the head and neck was performed using the standard  protocol during bolus administration of intravenous contrast. Multiplanar CT image reconstructions and MIPs were obtained to evaluate the vascular anatomy. Carotid stenosis measurements (when applicable) are obtained utilizing NASCET criteria, using the distal internal carotid diameter as the denominator. CONTRAST:  50 cc Isovue 370 COMPARISON:  MRI of the brain June 01, 2015 at 1424 hours FINDINGS: CT HEAD INTRACRANIAL CONTENTS: The ventricles and sulci are normal for age. No intraparenchymal hemorrhage, mass effect nor midline shift. Patchy supratentorial white matter hypodensities are less than expected for patient's age and though non-specific likely represent chronic small vessel ischemic disease. No abnormal intracranial enhancement. No acute large vascular territory infarcts. No abnormal extra-axial fluid collections. Basal cisterns are patent. Mild calcific atherosclerosis of the carotid siphons. ORBITS: The included ocular globes and orbital contents are non-suspicious. SINUSES: The mastoid aircells and included paranasal sinuses are well-aerated. SKULL/SOFT TISSUES: No skull fracture. No significant soft tissue swelling. CTA NECK Aortic arch: Normal appearance of the thoracic arch, normal branch pattern. Mild calcific atherosclerosis of the aortic arch. The origins of the innominate, left Common carotid artery and subclavian artery are widely patent. Right carotid system: Common carotid artery is widely patent, coursing in a straight line fashion. Normal appearance of the carotid bifurcation without hemodynamically significant stenosis by NASCET criteria. Minimal eccentric calcific atherosclerosis. Normal appearance of the included internal carotid artery. Left carotid system: Common carotid artery is widely patent, coursing in a straight line fashion. Normal appearance of the carotid bifurcation without hemodynamically significant stenosis by NASCET criteria. Minimal eccentric calcific atherosclerosis.  Normal appearance of the included internal carotid artery. Vertebral arteries:Left vertebral artery is dominant. Mild extrinsic deformity due to degenerative cervical spine. Patent vertebral arteries. Skeleton: No acute osseous process though bone windows have not been submitted. Multilevel moderate to severe degenerative discs and facet arthropathy with resultant mild canal stenosis C3-4, C4-5, C5-6 and C6-7. Severe LEFT C3-4, bilateral C4-5, RIGHT greater than LEFT C5-6 neural foraminal narrowing. Other neck: Soft tissues of the neck are non-acute though, not tailored for evaluation. Mild apical bullous changes on the RIGHT. Calcified subcarinal lymph nodes. Heterogeneous RIGHT thyroid lobe without dominant nodule, status post LEFT thyroidectomy. Mildly atrophic RIGHT submandibular gland. CTA HEAD Anterior circulation: Normal appearance of the cervical internal carotid arteries, petrous, cavernous and supra clinoid internal carotid arteries. Widely patent anterior communicating artery. Moderate stenosis proximal RIGHT M2 segment. Otherwise normal appearance of the anterior and middle cerebral arteries. Posterior circulation: Normal appearance of the vertebral arteries, vertebrobasilar junction and basilar artery, as well as main branch vessels. Robust bilateral posterior communicating arteries contribute to posterior circulation. Moderate stenosis proximal LEFT P2 segment. Mild luminal irregularity of the intracranial vessels without acute large vessel occlusion, scratch or aneurysm. 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 IMPRESSION: CT HEAD:  Negative CT head with and without contrast for age. CTA NECK: Atherosclerosis without hemodynamically significant stenosis or acute vascular process. CTA HEAD: No emergent  large vessel occlusion. 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 Moderate stenosis proximal RIGHT M2 segment and proximal LEFT P2 segment. Mild luminal irregularity of the intracranial  vessels compatible with atherosclerosis. Electronically Signed   By: Awilda Metro M.D.   On: 06/01/2015 23:56   Ct Head Wo Contrast  06/01/2015  CLINICAL DATA:  Diplopia and nausea since this morning. EXAM: CT HEAD WITHOUT CONTRAST TECHNIQUE: Contiguous axial images were obtained from the base of the skull through the vertex without intravenous contrast. COMPARISON:  None. FINDINGS: No mass lesion. No midline shift. No acute hemorrhage or hematoma. No extra-axial fluid collections. No evidence of acute infarction. Diffuse slight cerebellar atrophy and cerebral cortical atrophy. Brain parenchyma is otherwise normal. No bone abnormality. Visualized portion of the orbits is normal. IMPRESSION: No acute abnormality.  Atrophy as described. Electronically Signed   By: Francene Boyers M.D.   On: 06/01/2015 10:31   Ct Angio Neck W/cm &/or Wo/cm  06/01/2015  CLINICAL DATA:  Stroke.  History of hypertension and hyperlipidemia. EXAM: CT ANGIOGRAPHY HEAD AND NECK TECHNIQUE: Multidetector CT imaging of the head and neck was performed using the standard protocol during bolus administration of intravenous contrast. Multiplanar CT image reconstructions and MIPs were obtained to evaluate the vascular anatomy. Carotid stenosis measurements (when applicable) are obtained utilizing NASCET criteria, using the distal internal carotid diameter as the denominator. CONTRAST:  50 cc Isovue 370 COMPARISON:  MRI of the brain June 01, 2015 at 1424 hours FINDINGS: CT HEAD INTRACRANIAL CONTENTS: The ventricles and sulci are normal for age. No intraparenchymal hemorrhage, mass effect nor midline shift. Patchy supratentorial white matter hypodensities are less than expected for patient's age and though non-specific likely represent chronic small vessel ischemic disease. No abnormal intracranial enhancement. No acute large vascular territory infarcts. No abnormal extra-axial fluid collections. Basal cisterns are patent. Mild calcific  atherosclerosis of the carotid siphons. ORBITS: The included ocular globes and orbital contents are non-suspicious. SINUSES: The mastoid aircells and included paranasal sinuses are well-aerated. SKULL/SOFT TISSUES: No skull fracture. No significant soft tissue swelling. CTA NECK Aortic arch: Normal appearance of the thoracic arch, normal branch pattern. Mild calcific atherosclerosis of the aortic arch. The origins of the innominate, left Common carotid artery and subclavian artery are widely patent. Right carotid system: Common carotid artery is widely patent, coursing in a straight line fashion. Normal appearance of the carotid bifurcation without hemodynamically significant stenosis by NASCET criteria. Minimal eccentric calcific atherosclerosis. Normal appearance of the included internal carotid artery. Left carotid system: Common carotid artery is widely patent, coursing in a straight line fashion. Normal appearance of the carotid bifurcation without hemodynamically significant stenosis by NASCET criteria. Minimal eccentric calcific atherosclerosis. Normal appearance of the included internal carotid artery. Vertebral arteries:Left vertebral artery is dominant. Mild extrinsic deformity due to degenerative cervical spine. Patent vertebral arteries. Skeleton: No acute osseous process though bone windows have not been submitted. Multilevel moderate to severe degenerative discs and facet arthropathy with resultant mild canal stenosis C3-4, C4-5, C5-6 and C6-7. Severe LEFT C3-4, bilateral C4-5, RIGHT greater than LEFT C5-6 neural foraminal narrowing. Other neck: Soft tissues of the neck are non-acute though, not tailored for evaluation. Mild apical bullous changes on the RIGHT. Calcified subcarinal lymph nodes. Heterogeneous RIGHT thyroid lobe without dominant nodule, status post LEFT thyroidectomy. Mildly atrophic RIGHT submandibular gland. CTA HEAD Anterior circulation: Normal appearance of the cervical internal  carotid arteries, petrous, cavernous and supra clinoid internal carotid arteries. Widely patent anterior communicating artery. Moderate stenosis proximal  RIGHT M2 segment. Otherwise normal appearance of the anterior and middle cerebral arteries. Posterior circulation: Normal appearance of the vertebral arteries, vertebrobasilar junction and basilar artery, as well as main branch vessels. Robust bilateral posterior communicating arteries contribute to posterior circulation. Moderate stenosis proximal LEFT P2 segment. Mild luminal irregularity of the intracranial vessels without acute large vessel occlusion, scratch or aneurysm. 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 IMPRESSION: CT HEAD:  Negative CT head with and without contrast for age. CTA NECK: Atherosclerosis without hemodynamically significant stenosis or acute vascular process. CTA HEAD: No emergent large vessel occlusion. 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 Moderate stenosis proximal RIGHT M2 segment and proximal LEFT P2 segment. Mild luminal irregularity of the intracranial vessels compatible with atherosclerosis. Electronically Signed   By: Awilda Metro M.D.   On: 06/01/2015 23:56   Mr Brain Wo Contrast  06/01/2015  CLINICAL DATA:  Diplopia and nausea beginning this morning. EXAM: MRI HEAD WITHOUT CONTRAST TECHNIQUE: Multiplanar, multiecho pulse sequences of the brain and surrounding structures were obtained without intravenous contrast. COMPARISON:  Head CT 06/01/2015 FINDINGS: There is a punctate 3 mm acute infarct in the posterior right pons. There is no evidence of intracranial hemorrhage, mass, midline shift, or extra-axial fluid collection. There is mild cerebral and cerebellar atrophy. T2 hyperintensities in the subcortical and periventricular cerebral white matter are compatible with mild chronic small vessel ischemic disease. There is a chronic left central pontine infarct. Orbits are unremarkable. A right maxillary sinus  mucous retention cyst is noted. The mastoid air cells are clear. Major intracranial vascular flow voids are preserved. IMPRESSION: 1. Punctate acute right pontine infarct. 2. Chronic left pontine infarct and chronic small vessel ischemic change in the cerebral white matter. Electronically Signed   By: Sebastian Ache M.D.   On: 06/01/2015 15:09   Medications: I have reviewed the patient's current medications. Scheduled Meds: . atorvastatin  80 mg Oral q1800  . [START ON 06/03/2015] clopidogrel  75 mg Oral Daily  . enoxaparin (LOVENOX) injection  40 mg Subcutaneous Q24H  . potassium chloride  40 mEq Oral Once  . sodium chloride flush  3 mL Intravenous Q12H   Continuous Infusions:  PRN Meds:. Assessment/Plan: Active Problems:   Acute ischemic stroke (HCC)  Acute right pontine infarct He still has intermittent diplopia but only when looking at objects that are far away. Not having any nausea/ vomtiing and tolerating PO intake well. CTA of head showing tandem severe stenosis distal LEFT V4 segment. Moderate stenosis proximal RIGHT M2 segment and proximal LEFT P2 segment. Mild luminal irregularity of the intracranial vessels compatible with atherosclerosis. CTA of neck showing atherosclerosis but no significant stenosis or acute vascular process. Lipid panel showing cholesterol 176, HDL 67, and LDL 95.  -Neurology is on board, appreciate recs -Monitor on telemetry  -Aspirin 325 mg daily  -Lipitor 80 mg daily  -Frequent neuro checks  -Hgb A1c pending -Echo pending -PT/OT/ speech consult   Elevated serum creatinine Cr 1.2, no baseline to compare. Likely pre-renal due to dehydration from vomiting. Creatinine improved to 1.0 today.  -BMP in a.m.  Hypokalemia K 3.4 today and repleted.  -BMP in am  HTN -Hold home BP meds to allow permissive HTN (<220/120) -Plan is to discontinue his home medication Clonidine to prevent rebound HTN. He will be started on HCTZ prior to discharge.   DVT/ PE  ppx: Lovenox   Diet: heart healthy  Code: Full  Dispo: Disposition is deferred at this time, awaiting improvement of  current medical problems.  Anticipated discharge in approximately 1-2 day(s).   The patient does have a current PCP (Pcp Not In System) and does need an [facility] hospital follow-up appointment after discharge.  The patient does not have transportation limitations that hinder transportation to clinic appointments.  .Services Needed at time of discharge: Y = Yes, Blank = No PT:   OT:   RN:   Equipment:   Other:     LOS: 1 day   John Giovanni, MD 06/02/2015, 11:08 AM

## 2015-06-02 NOTE — Progress Notes (Signed)
OT Cancellation Note  Patient Details Name: Theodore NunneryFrancis K Zertuche Jr. MRN: [id number] DOB: [birth year]   Cancelled Treatment:    Reason Eval/Treat Not Completed: Patient at procedure or test/ unavailable (Will continue to follow.)  Evern BioMayberry, Shantara Goosby Lynn 06/02/2015, 1:40 PM

## 2015-06-02 NOTE — Progress Notes (Signed)
  Date: 06/02/2015  Patient name: Theodore NunneryFrancis K Dunn Jr.  Medical record number: [id number]  Date of birth: August 12, 1930   I have seen and evaluated Theodore NunneryFrancis K Davalos Jr. and discussed their care with the Residency Team. In brief, patient is a 82 y/o male with PMH of  HTN, HLD, BPH, polio who p/w diplopia * 1 day. [place] when he noted sudden onset diplopia while on the highway and needed to close one eye to continue driving. He had complained of nausea and 1 episode of emesis prior to leaving his house and on arriving in [place] noted lightheadedness and recurrent n/v. No hematemesis. No fevers/chills, no HA, no syncope, no focal weakness. He did note that he felt unsteady while ambulating.He has persistent diplopia today.  PMHx, Fam Hx, and/or Soc Hx : as per residenta dmit note  Filed Vitals:   06/02/15 0800 06/02/15 0937  BP: 161/115 192/94  Pulse: 70 58  Temp: 98.1 F (36.7 C) 98.4 F (36.9 C)  Resp: 20 18   Gen: AAO*3, NAD CVS: RRR, normal heart sounds LUngs: CTA b/l Abd: soft, non tender, BS + Ext: no edema Neuro: Power 5/5 b/l UE, LE, sensation intact, persistent diplopia + but improving  Assessment and Plan: I have seen and evaluated the patient as outlined above. I agree with the formulated Assessment and Plan as detailed in the residents' note, with the following changes:   1. Acute pontine CVA: - Neuro f/u appreciated - Stroke team to f/u today - Will need PT/OT eval - Will f/u 2 D ECHO - c/w lipitor. Would d/c asa and start plavix as patient was on baby asa at home and still developed a CVA - Will allow permissive HTN for 1st 48 hours  2. HTN: - Will allow permissive HTN for 1st 48 hours  Post CVA - I do not believe clonidine is an appropriate medication for him as he occasionally misses doses and has not been on other first line treatment like thiazides - Will start HCTZ in AM for BP control  3. AKI: - Likely pre renal. Resolved with  IVF - will monitor   Earl LagosNischal Natahsa Marian, MD 4/29/20171:21 PM

## 2015-06-02 NOTE — Progress Notes (Signed)
STROKE TEAM PROGRESS NOTE   HISTORY OF PRESENT ILLNESS Theodore NunneryFrancis K Broyles Jr. is an 82 y.o. male who was showering at 5 AM. He states he was up early because he was going to be driving to this area. While in the shower, he reports he felt dizzy and not very well. He did become nauseated and did have emesis. He still continued to drive. At about 6:15 he noted diplopia and noted if he closed one eye it would resolve. Due to persistent diplopia he came to ED . While in ED he had a MRI which was positive for CVA. Currently he continues to have diplopia --more when looking out in distance and resolves with near vision.   Date last known well: Today Time last known well: Time: 05:00 tPA Given: No: out of window   SUBJECTIVE (INTERVAL HISTORY) No family members present. The patient does not live in [place]. He was here for a meeting. He continues to experience diplopia.    OBJECTIVE Temp:  [97.7 F (36.5 C)-98.6 F (37 C)] 98.1 F (36.7 C) (04/29 0800) Pulse Rate:  [50-70] 70 (04/29 0800) Cardiac Rhythm:  [-] Sinus bradycardia (04/28 2000) Resp:  [13-22] 20 (04/29 0800) BP: (154-235)/(76-116) 161/115 mmHg (04/29 0800) SpO2:  [95 %-100 %] 99 % (04/29 0800) Weight:  [90.719 kg (200 lb)] 90.719 kg (200 lb) (04/28 0928)  CBC:  Recent Labs Lab 06/01/15 0936  WBC 5.6  NEUTROABS 4.4  HGB 14.5  HCT 41.9  MCV 90.1  PLT 164    Basic Metabolic Panel:  Recent Labs Lab 06/01/15 0936 06/02/15 0445  NA 137 144  K 3.2* 3.4*  CL 107 106  CO2 26 28  GLUCOSE 149* 101*  BUN 25* 13  CREATININE 1.28* 1.02  CALCIUM 8.7* 8.7*    Lipid Panel:    Component Value Date/Time   CHOL 176 06/01/2015 2104   TRIG 72 06/01/2015 2104   HDL 67 06/01/2015 2104   CHOLHDL 2.6 06/01/2015 2104   VLDL 14 06/01/2015 2104   LDLCALC 95 06/01/2015 2104   HgbA1c: No results found for: HGBA1C Urine Drug Screen:    Component Value Date/Time   LABOPIA NONE DETECTED 06/01/2015 2121   COCAINSCRNUR NONE  DETECTED 06/01/2015 2121   LABBENZ NONE DETECTED 06/01/2015 2121   AMPHETMU NONE DETECTED 06/01/2015 2121   THCU NONE DETECTED 06/01/2015 2121   LABBARB NONE DETECTED 06/01/2015 2121      IMAGING  Ct Angio Head and Neck W/cm &/or Wo Cm 06/01/2015    CT HEAD:   Negative CT head with and without contrast for age.   CTA NECK:  Atherosclerosis without hemodynamically significant stenosis or acute vascular process.   CTA HEAD:  No emergent large vessel occlusion. 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 Moderate stenosis proximal RIGHT M2 segment and proximal LEFT P2 segment. Mild luminal irregularity of the intracranial vessels compatible with atherosclerosis.   Ct Head Wo Contrast 06/01/2015   No acute abnormality.  Atrophy as described. E    Mr Brain Wo Contrast 06/01/2015   1. Punctate acute right pontine infarct.  2. Chronic left pontine infarct and chronic small vessel ischemic change in the cerebral white matter.     PHYSICAL EXAM  Pleasant elderly male not in distress. . Afebrile. Head is nontraumatic. Neck is supple without bruit.    Cardiac exam no murmur or gallop. Lungs are clear to auscultation.  .  Neurological Exam ;  Awake  Alert oriented x 3. Normal speech  and language.eye movements full without nystagmus.fundi were not visualized. Vision acuity and fields appear normal. Hearing is normal. Palatal movements are normal. Face symmetric. Tongue midline. Normal strength, tone, reflexes and coordination.  Except  right thigh is wasted and weak from polio. Normal sensation. Gait deferred.    ASSESSMENT/PLAN [name]. is a 82 y.o. male with history of hypertension, hyperlipidemia, and childhood polio  presenting with diplopia, dizziness, nausea, and vomiting.Marland Kitchen He did not receive IV t-PA due to late presentation.  Stroke:  Non-dominant infarct possibly embolic from an unknown source.  Resultant diplopia  MRI  Punctate acute right pontine  infarct and chronic left pontine infarct   MRA - not performed  CTA of the head and neck - Tandem severe stenosis distal LEFT V4 segment.  Carotid Doppler - refer to CTA of the head and neck  2D Echo - pending  LDL - 95  HgbA1c pending  VTE prophylaxis - Lovenox  Diet Heart Room service appropriate?: Yes; Fluid consistency:: Thin  aspirin 81 mg daily prior to admission, now on clopidogrel 75 mg daily  Patient counseled to be compliant with his antithrombotic medications  Ongoing aggressive stroke risk factor management  Therapy recommendations: Pending  Disposition: Pending  Hypertension  Stable - although somewhat high at times  Permissive hypertension (OK if < 220/120) but gradually normalize in 5-7 days  Hyperlipidemia  Home meds:  Pravachol 20 mg daily not resumed in hospital  LDL 95, goal < 70  Now on Lipitor 80 mg daily  Continue statin at discharge    Other Stroke Risk Factors  Advanced age  Hx stroke/TIA - by MRI   Other Active Problems  Hypokalemia - supplemented  Remote polio right leg   Hospital day # 1  Delton See PA-C [facility] Pager [phone] 06/02/2015, 2:57 PM I have personally examined this patient, reviewed notes, independently viewed imaging studies, participated in medical decision making and plan of care. I have made any additions or clarifications directly to the above note. Agree with note above. He presented with diplopia due to small Pontine infarct likely from intracranial atherosclerosis as well as small vessel disease.He remains at risk for neurological worsening, recurrent stroke, TIA and needs ongoing stroke eval and risk factor control. Greater than 50% time during this 35 minute visit was spent on counselling about stroke risk and prevention.  [name], MD Medical Director [facility] Pager: [phone] 06/02/2015 3:58 PM     To contact Stroke Continuity provider, please  refer to [email or web address]. After hours, contact General Neurology

## 2015-06-03 ENCOUNTER — Inpatient Hospital Stay (HOSPITAL_COMMUNITY): Payer: Medicare Other

## 2015-06-03 ENCOUNTER — Encounter (HOSPITAL_COMMUNITY): Payer: Self-pay | Admitting: General Practice

## 2015-06-03 LAB — BASIC METABOLIC PANEL
Anion gap: 8 (ref 5–15)
BUN: 15 mg/dL (ref 6–20)
CHLORIDE: 104 mmol/L (ref 101–111)
CO2: 28 mmol/L (ref 22–32)
CREATININE: 1.13 mg/dL (ref 0.61–1.24)
Calcium: 8.7 mg/dL — ABNORMAL LOW (ref 8.9–10.3)
GFR calc non Af Amer: 58 mL/min — ABNORMAL LOW (ref >60)
Glucose, Bld: 126 mg/dL — ABNORMAL HIGH (ref 65–99)
Potassium: 3.5 mmol/L (ref 3.5–5.1)
Sodium: 140 mmol/L (ref 135–145)

## 2015-06-03 LAB — ECHOCARDIOGRAM COMPLETE: Weight: 3200 oz

## 2015-06-03 MED ORDER — HYDROCHLOROTHIAZIDE 12.5 MG PO TABS: 12.5000 mg | ORAL_TABLET | Freq: Every day | ORAL | Status: AC

## 2015-06-03 MED ORDER — CLOPIDOGREL BISULFATE 75 MG PO TABS: 75.0000 mg | ORAL_TABLET | Freq: Every day | ORAL | Status: AC

## 2015-06-03 MED ORDER — ATORVASTATIN CALCIUM 80 MG PO TABS: 80.0000 mg | ORAL_TABLET | Freq: Every day | ORAL | Status: AC

## 2015-06-03 NOTE — Progress Notes (Signed)
STROKE TEAM PROGRESS NOTE   HISTORY OF PRESENT ILLNESS Theodore NunneryFrancis K Saleeby Jr. is an 82 y.o. male who was showering at 5 AM. He states he was up early because he was going to be driving to this area. While in the shower, he reports he felt dizzy and not very well. He did become nauseated and did have emesis. He still continued to drive. At about 6:15 he noted diplopia and noted if he closed one eye it would resolve. Due to persistent diplopia he came to ED . While in ED he had a MRI which was positive for CVA. Currently he continues to have diplopia --more when looking out in distance and resolves with near vision.   Date last known well: Today Time last known well: Time: 05:00 tPA Given: No: out of window   SUBJECTIVE (INTERVAL HISTORY) His fraternity friend is  present. The patient is doing better and wants to go home..    OBJECTIVE Temp:  [97.4 F (36.3 C)-98.1 F (36.7 C)] 98.1 F (36.7 C) (04/30 1352) Pulse Rate:  [58-69] 67 (04/30 1352) Cardiac Rhythm:  [-] Sinus bradycardia;Heart block (04/29 2044) Resp:  [18-20] 18 (04/30 1352) BP: (159-215)/(93-119) 159/119 mmHg (04/30 1352) SpO2:  [98 %-100 %] 100 % (04/30 1352)  CBC:   Recent Labs Lab 06/01/15 0936  WBC 5.6  NEUTROABS 4.4  HGB 14.5  HCT 41.9  MCV 90.1  PLT 164    Basic Metabolic Panel:   Recent Labs Lab 06/02/15 0445 06/03/15 0240  NA 144 140  K 3.4* 3.5  CL 106 104  CO2 28 28  GLUCOSE 101* 126*  BUN 13 15  CREATININE 1.02 1.13  CALCIUM 8.7* 8.7*    Lipid Panel:     Component Value Date/Time   CHOL 176 06/01/2015 2104   TRIG 72 06/01/2015 2104   HDL 67 06/01/2015 2104   CHOLHDL 2.6 06/01/2015 2104   VLDL 14 06/01/2015 2104   LDLCALC 95 06/01/2015 2104   HgbA1c: No results found for: HGBA1C Urine Drug Screen:     Component Value Date/Time   LABOPIA NONE DETECTED 06/01/2015 2121   COCAINSCRNUR NONE DETECTED 06/01/2015 2121   LABBENZ NONE DETECTED 06/01/2015 2121   AMPHETMU NONE DETECTED  06/01/2015 2121   THCU NONE DETECTED 06/01/2015 2121   LABBARB NONE DETECTED 06/01/2015 2121      IMAGING  Ct Angio Head and Neck W/cm &/or Wo Cm 06/01/2015    CT HEAD:   Negative CT head with and without contrast for age.   CTA NECK:  Atherosclerosis without hemodynamically significant stenosis or acute vascular process.   CTA HEAD:  No emergent large vessel occlusion. 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 Moderate stenosis proximal RIGHT M2 segment and proximal LEFT P2 segment. Mild luminal irregularity of the intracranial vessels compatible with atherosclerosis.   Ct Head Wo Contrast 06/01/2015   No acute abnormality.  Atrophy as described. E    Mr Brain Wo Contrast 06/01/2015   1. Punctate acute right pontine infarct.  2. Chronic left pontine infarct and chronic small vessel ischemic change in the cerebral white matter.     PHYSICAL EXAM  Pleasant elderly male not in distress. . Afebrile. Head is nontraumatic. Neck is supple without bruit.    Cardiac exam no murmur or gallop. Lungs are clear to auscultation.  .  Neurological Exam ;  Awake  Alert oriented x 3. Normal speech and language.eye movements full without nystagmus.fundi were not visualized. Vision acuity and fields  appear normal. Hearing is normal. Palatal movements are normal. Face symmetric. Tongue midline. Normal strength, tone, reflexes and coordination.  Except  right thigh is wasted and weak from polio. Normal sensation. Gait deferred.    ASSESSMENT/PLAN [name]. is a 82 y.o. male with history of hypertension, hyperlipidemia, and childhood polio  presenting with diplopia, dizziness, nausea, and vomiting.Marland Kitchen He did not receive IV t-PA due to late presentation.  Stroke:  Non-dominant infarct possibly embolic from an unknown source.  Resultant diplopia  MRI  Punctate acute right pontine infarct and chronic left pontine infarct   MRA - not performed  CTA of the head and neck -  Tandem severe stenosis distal LEFT V4 segment.  Carotid Doppler - refer to CTA of the head and neck 2D Echo - Left ventricle: The cavity size was normal. There was moderate  concentric hypertrophy. Systolic function was normal. The  estimated ejection fraction was in the range of 55% to 60%. Wall  motion was normal; there were no regional wall motion   abnormalities.LDL - 95  HgbA1c pending  VTE prophylaxis - Lovenox Diet Heart Room service appropriate?: Yes; Fluid consistency:: Thin Diet - low sodium heart healthy  aspirin 81 mg daily prior to admission, now on clopidogrel 75 mg daily  Patient counseled to be compliant with his antithrombotic medications  Ongoing aggressive stroke risk factor management  Therapy recommendations: none Disposition: home Hypertension  Stable - although somewhat high at times  Permissive hypertension (OK if < 220/120) but gradually normalize in 5-7 days  Hyperlipidemia  Home meds:  Pravachol 20 mg daily not resumed in hospital  LDL 95, goal < 70  Now on Lipitor 80 mg daily  Continue statin at discharge    Other Stroke Risk Factors  Advanced age  Hx stroke/TIA - by MRI   Other Active Problems  Hypokalemia - supplemented  Remote polio right leg   Hospital day # 2    I have personally examined this patient, reviewed notes, independently viewed imaging studies, participated in medical decision making and plan of care. I have made any additions or clarifications directly to the above note. Agree with note above. He presented with diplopia due to small Pontine infarct likely from intracranial atherosclerosis as well as small vessel disease.Patient discharged home with parents for follow-up with his primary care physician where he lives.. Greater than 50% time during this 15 minute visit was spent on counselling about stroke risk and prevention.  [name], MD Medical Director [facility] Pager:  [phone] 06/03/2015 3:10 PM     To contact Stroke Continuity provider, please refer to [email or web address]. After hours, contact General Neurology

## 2015-06-03 NOTE — Discharge Summary (Signed)
Name: Theodore Tanner. MRN: [id number] DOB: 04/05/1930 82 y.o. PCP: Pcp Not In System  Date of Admission: 06/01/2015  9:17 AM Date of Discharge: 06/03/2015 Attending Physician: Earl Lagos, MD  Discharge Diagnosis: Active Problems:   Acute ischemic stroke [facility]) Hypertension  Discharge Medications:   Medication List    STOP taking these medications        aspirin 81 MG EC tablet     cloNIDine 0.1 MG tablet  Commonly known as:  CATAPRES     pravastatin 20 MG tablet  Commonly known as:  PRAVACHOL     prazosin 2 MG capsule  Commonly known as:  MINIPRESS      TAKE these medications        atorvastatin 80 MG tablet  Commonly known as:  LIPITOR  Take 1 tablet (80 mg total) by mouth daily at 6 PM.     clopidogrel 75 MG tablet  Commonly known as:  PLAVIX  Take 1 tablet (75 mg total) by mouth daily.     finasteride 5 MG tablet  Commonly known as:  PROSCAR  Take 5 mg by mouth daily.     Fish Oil 1000 MG Caps  Take 1,000 mg by mouth every evening.     furosemide 20 MG tablet  Commonly known as:  LASIX  Take 20 mg by mouth.     hydrochlorothiazide 12.5 MG tablet  Commonly known as:  HYDRODIURIL  Take 1 tablet (12.5 mg total) by mouth daily.     multivitamin with minerals Tabs tablet  Take 1 tablet by mouth daily.     nystatin cream  Commonly known as:  MYCOSTATIN  Apply 1 application topically daily as needed.     olmesartan 40 MG tablet  Commonly known as:  BENICAR  Take 40 mg by mouth daily.     tamsulosin 0.4 MG Caps capsule  Commonly known as:  FLOMAX  Take 0.4 mg by mouth daily after supper.     VITAMIN C PO  Take 1 tablet by mouth daily.     VITAMIN D PO  Take 1 tablet by mouth daily.        Disposition and follow-up:   Theodore Tanner. was discharged from [facility] in Stable condition.  At the hospital follow up visit please address:  1.  Acute right pontine infarct: On Plavix daily  HTN: Patient  discharged on home Lasix 20mg  daily and olmesartan. He will start in 1-2 days, hydrochlorothiazide 12.5mg  daily. He was instructed to stop clonidine and prazosin.  2.  Labs / imaging needed at time of follow-up: None  3.  Pending labs/ test needing follow-up: Hemoglobin A1c  Follow-up Appointments:     Follow-up Information    Follow up with Primary care provider. Schedule an appointment as soon as possible for a visit in 1 week.   Why:  Hospital follow up      Follow up with [facility]. Schedule an appointment as soon as possible for a visit in 2 months.   Why:  Stroke follow up   Contact information:   [address]     [address] [place] [address] [phone]      Discharge Instructions: Discharge Instructions    Ambulatory referral to Neurology    Complete by:  As directed   An appointment is requested in approximately: 8 weeks     Call MD for:  difficulty breathing, headache or visual disturbances  Complete by:  As directed      Call MD for:  persistant dizziness or light-headedness    Complete by:  As directed      Call MD for:  persistant nausea and vomiting    Complete by:  As directed      Call MD for:  severe uncontrolled pain    Complete by:  As directed      Diet - low sodium heart healthy    Complete by:  As directed      Increase activity slowly    Complete by:  As directed            Consultations: Neurology  Procedures Performed:  Ct Angio Head W/cm &/or Wo Cm  06/01/2015  CLINICAL DATA:  Stroke.  History of hypertension and hyperlipidemia. EXAM: CT ANGIOGRAPHY HEAD AND NECK TECHNIQUE: Multidetector CT imaging of the head and neck was performed using the standard protocol during bolus administration of intravenous contrast. Multiplanar CT image reconstructions and MIPs were obtained to evaluate the vascular anatomy. Carotid stenosis measurements (when applicable) are obtained utilizing NASCET criteria, using  the distal internal carotid diameter as the denominator. CONTRAST:  50 cc Isovue 370 COMPARISON:  MRI of the brain June 01, 2015 at 1424 hours FINDINGS: CT HEAD INTRACRANIAL CONTENTS: The ventricles and sulci are normal for age. No intraparenchymal hemorrhage, mass effect nor midline shift. Patchy supratentorial white matter hypodensities are less than expected for patient's age and though non-specific likely represent chronic small vessel ischemic disease. No abnormal intracranial enhancement. No acute large vascular territory infarcts. No abnormal extra-axial fluid collections. Basal cisterns are patent. Mild calcific atherosclerosis of the carotid siphons. ORBITS: The included ocular globes and orbital contents are non-suspicious. SINUSES: The mastoid aircells and included paranasal sinuses are well-aerated. SKULL/SOFT TISSUES: No skull fracture. No significant soft tissue swelling. CTA NECK Aortic arch: Normal appearance of the thoracic arch, normal branch pattern. Mild calcific atherosclerosis of the aortic arch. The origins of the innominate, left Common carotid artery and subclavian artery are widely patent. Right carotid system: Common carotid artery is widely patent, coursing in a straight line fashion. Normal appearance of the carotid bifurcation without hemodynamically significant stenosis by NASCET criteria. Minimal eccentric calcific atherosclerosis. Normal appearance of the included internal carotid artery. Left carotid system: Common carotid artery is widely patent, coursing in a straight line fashion. Normal appearance of the carotid bifurcation without hemodynamically significant stenosis by NASCET criteria. Minimal eccentric calcific atherosclerosis. Normal appearance of the included internal carotid artery. Vertebral arteries:Left vertebral artery is dominant. Mild extrinsic deformity due to degenerative cervical spine. Patent vertebral arteries. Skeleton: No acute osseous process though bone  windows have not been submitted. Multilevel moderate to severe degenerative discs and facet arthropathy with resultant mild canal stenosis C3-4, C4-5, C5-6 and C6-7. Severe LEFT C3-4, bilateral C4-5, RIGHT greater than LEFT C5-6 neural foraminal narrowing. Other neck: Soft tissues of the neck are non-acute though, not tailored for evaluation. Mild apical bullous changes on the RIGHT. Calcified subcarinal lymph nodes. Heterogeneous RIGHT thyroid lobe without dominant nodule, status post LEFT thyroidectomy. Mildly atrophic RIGHT submandibular gland. CTA HEAD Anterior circulation: Normal appearance of the cervical internal carotid arteries, petrous, cavernous and supra clinoid internal carotid arteries. Widely patent anterior communicating artery. Moderate stenosis proximal RIGHT M2 segment. Otherwise normal appearance of the anterior and middle cerebral arteries. Posterior circulation: Normal appearance of the vertebral arteries, vertebrobasilar junction and basilar artery, as well as main branch vessels. Robust bilateral posterior communicating arteries contribute  to posterior circulation. Moderate stenosis proximal LEFT P2 segment. Mild luminal irregularity of the intracranial vessels without acute large vessel occlusion, scratch or aneurysm. 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 IMPRESSION: CT HEAD:  Negative CT head with and without contrast for age. CTA NECK: Atherosclerosis without hemodynamically significant stenosis or acute vascular process. CTA HEAD: No emergent large vessel occlusion. 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 Moderate stenosis proximal RIGHT M2 segment and proximal LEFT P2 segment. Mild luminal irregularity of the intracranial vessels compatible with atherosclerosis. Electronically Signed   By: Awilda Metro M.D.   On: 06/01/2015 23:56   Ct Head Wo Contrast  06/01/2015  CLINICAL DATA:  Diplopia and nausea since this morning. EXAM: CT HEAD WITHOUT CONTRAST TECHNIQUE:  Contiguous axial images were obtained from the base of the skull through the vertex without intravenous contrast. COMPARISON:  None. FINDINGS: No mass lesion. No midline shift. No acute hemorrhage or hematoma. No extra-axial fluid collections. No evidence of acute infarction. Diffuse slight cerebellar atrophy and cerebral cortical atrophy. Brain parenchyma is otherwise normal. No bone abnormality. Visualized portion of the orbits is normal. IMPRESSION: No acute abnormality.  Atrophy as described. Electronically Signed   By: Francene Boyers M.D.   On: 06/01/2015 10:31   Ct Angio Neck W/cm &/or Wo/cm  06/01/2015  CLINICAL DATA:  Stroke.  History of hypertension and hyperlipidemia. EXAM: CT ANGIOGRAPHY HEAD AND NECK TECHNIQUE: Multidetector CT imaging of the head and neck was performed using the standard protocol during bolus administration of intravenous contrast. Multiplanar CT image reconstructions and MIPs were obtained to evaluate the vascular anatomy. Carotid stenosis measurements (when applicable) are obtained utilizing NASCET criteria, using the distal internal carotid diameter as the denominator. CONTRAST:  50 cc Isovue 370 COMPARISON:  MRI of the brain June 01, 2015 at 1424 hours FINDINGS: CT HEAD INTRACRANIAL CONTENTS: The ventricles and sulci are normal for age. No intraparenchymal hemorrhage, mass effect nor midline shift. Patchy supratentorial white matter hypodensities are less than expected for patient's age and though non-specific likely represent chronic small vessel ischemic disease. No abnormal intracranial enhancement. No acute large vascular territory infarcts. No abnormal extra-axial fluid collections. Basal cisterns are patent. Mild calcific atherosclerosis of the carotid siphons. ORBITS: The included ocular globes and orbital contents are non-suspicious. SINUSES: The mastoid aircells and included paranasal sinuses are well-aerated. SKULL/SOFT TISSUES: No skull fracture. No significant soft  tissue swelling. CTA NECK Aortic arch: Normal appearance of the thoracic arch, normal branch pattern. Mild calcific atherosclerosis of the aortic arch. The origins of the innominate, left Common carotid artery and subclavian artery are widely patent. Right carotid system: Common carotid artery is widely patent, coursing in a straight line fashion. Normal appearance of the carotid bifurcation without hemodynamically significant stenosis by NASCET criteria. Minimal eccentric calcific atherosclerosis. Normal appearance of the included internal carotid artery. Left carotid system: Common carotid artery is widely patent, coursing in a straight line fashion. Normal appearance of the carotid bifurcation without hemodynamically significant stenosis by NASCET criteria. Minimal eccentric calcific atherosclerosis. Normal appearance of the included internal carotid artery. Vertebral arteries:Left vertebral artery is dominant. Mild extrinsic deformity due to degenerative cervical spine. Patent vertebral arteries. Skeleton: No acute osseous process though bone windows have not been submitted. Multilevel moderate to severe degenerative discs and facet arthropathy with resultant mild canal stenosis C3-4, C4-5, C5-6 and C6-7. Severe LEFT C3-4, bilateral C4-5, RIGHT greater than LEFT C5-6 neural foraminal narrowing. Other neck: Soft tissues of the neck are non-acute though, not  tailored for evaluation. Mild apical bullous changes on the RIGHT. Calcified subcarinal lymph nodes. Heterogeneous RIGHT thyroid lobe without dominant nodule, status post LEFT thyroidectomy. Mildly atrophic RIGHT submandibular gland. CTA HEAD Anterior circulation: Normal appearance of the cervical internal carotid arteries, petrous, cavernous and supra clinoid internal carotid arteries. Widely patent anterior communicating artery. Moderate stenosis proximal RIGHT M2 segment. Otherwise normal appearance of the anterior and middle cerebral arteries. Posterior  circulation: Normal appearance of the vertebral arteries, vertebrobasilar junction and basilar artery, as well as main branch vessels. Robust bilateral posterior communicating arteries contribute to posterior circulation. Moderate stenosis proximal LEFT P2 segment. Mild luminal irregularity of the intracranial vessels without acute large vessel occlusion, scratch or aneurysm. 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 IMPRESSION: CT HEAD:  Negative CT head with and without contrast for age. CTA NECK: Atherosclerosis without hemodynamically significant stenosis or acute vascular process. CTA HEAD: No emergent large vessel occlusion. 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 Moderate stenosis proximal RIGHT M2 segment and proximal LEFT P2 segment. Mild luminal irregularity of the intracranial vessels compatible with atherosclerosis. Electronically Signed   By: Awilda Metro M.D.   On: 06/01/2015 23:56   Mr Brain Wo Contrast  06/01/2015  CLINICAL DATA:  Diplopia and nausea beginning this morning. EXAM: MRI HEAD WITHOUT CONTRAST TECHNIQUE: Multiplanar, multiecho pulse sequences of the brain and surrounding structures were obtained without intravenous contrast. COMPARISON:  Head CT 06/01/2015 FINDINGS: There is a punctate 3 mm acute infarct in the posterior right pons. There is no evidence of intracranial hemorrhage, mass, midline shift, or extra-axial fluid collection. There is mild cerebral and cerebellar atrophy. T2 hyperintensities in the subcortical and periventricular cerebral white matter are compatible with mild chronic small vessel ischemic disease. There is a chronic left central pontine infarct. Orbits are unremarkable. A right maxillary sinus mucous retention cyst is noted. The mastoid air cells are clear. Major intracranial vascular flow voids are preserved. IMPRESSION: 1. Punctate acute right pontine infarct. 2. Chronic left pontine infarct and chronic small vessel ischemic change in the  cerebral white matter. Electronically Signed   By: Sebastian Ache M.D.   On: 06/01/2015 15:09    2D Echo:  Study Conclusions  - Left ventricle: The cavity size was normal. There was moderate  concentric hypertrophy. Systolic function was normal. The  estimated ejection fraction was in the range of 55% to 60%. Wall  motion was normal; there were no regional wall motion  abnormalities. - Aortic valve: There was mild regurgitation. - Left atrium: The atrium was moderately to severely dilated.  Impressions:  - No cardiac source of emboli was indentified.  Transthoracic echocardiography. M-mode, complete 2D, spectral Doppler, and color Doppler. Birthdate: Patient birthdate: [birth year]. Age: Patient is 82 yr old. Sex: Gender: male. BMI: 28.7 kg/m^2. Blood pressure: 190/93 Patient status: Inpatient. Study date: Study date: 06/03/2015. Study time: 09:58 AM. Location: Bedside.  Admission HPI: 82 year old male with a past medical history of hypertension, hyperlipidemia, BPH, and polio (right lower extremity weakness) presenting to the hospital with a chief complaint of "seeing double" when he was driving earlier today. Patient reports having diplopia when using both of his eyes to see and states diplopia resolves when he only uses one eye. States he was on the highway when this happened and he kept on driving for 2 hours. When he finally reached his hotel he felt nauseated and vomited. Also reports feeling "woozy" and having problems with balance at that time. Denies having any focal weakness, numbness,  or tingling. States this has never happened to him before and he was in his normal state of health until this morning. Denies having any prior history of seizures, stroke, or any other neurological disorder. Denies losing consciousness or having any speech difficulty.  Hospital Course by problem list:  1. Acute right pontine infarct: CTA of head showing tandem severe stenosis  distal LEFT V4 segment. Moderate stenosis proximal RIGHT M2 segment and proximal LEFT P2 segment. Mild luminal irregularity of the intracranial vessels compatible with atherosclerosis. CTA of neck showing atherosclerosis but no significant stenosis or acute vascular process. He was admitted for routine stroke work up. He was seen in consultation by neurology. Lipid panel demonstrates cholesterol 176, HDL 67, and LDL 95. He was started on Plavix 75mg  daily and Lipitor 80mg  daily. Echo performed with results as noted above. He was evaluated by PT/OT who recommended 24 hour supervision/assitance at least initially for a few days. He had minimal residual diplopia on discharge. He was discharged with instructions to follow up with PCP and neurology.  2. AKI, Hypokalemia: Cr 1.2, no baseline to compare. Likely pre-renal due to dehydration from vomiting. Creatinine improved to 1. K initially 3.4 but improved to 3.5 with repletion.  3. HTN: Held home BP meds to allow permissive HTN (<220/120) for first 48-72 hours. Patient discharged on home Lasix 20mg  daily and olmesartan. He will start in 1-2 days, hydrochlorothiazide 12.5mg  daily. He was instructed to stop clonidine and prazosin.  Discharge Vitals:   BP 159/119 mmHg  Pulse 67  Temp(Src) 98.1 F (36.7 C) (Oral)  Resp 18  Wt 200 lb (90.719 kg)  SpO2 100%  Discharge Labs:  Results for orders placed or performed during the hospital encounter of 06/01/15 (from the past 24 hour(s))  Basic metabolic panel     Status: Abnormal   Collection Time: 06/03/15  2:40 AM  Result Value Ref Range   Sodium 140 135 - 145 mmol/L   Potassium 3.5 3.5 - 5.1 mmol/L   Chloride 104 101 - 111 mmol/L   CO2 28 22 - 32 mmol/L   Glucose, Bld 126 (H) 65 - 99 mg/dL   BUN 15 6 - 20 mg/dL   Creatinine, Ser 2.131.13 0.61 - 1.24 mg/dL   Calcium 8.7 (L) 8.9 - 10.3 mg/dL   GFR calc non Af Amer 58 (L) >60 mL/min   GFR calc Af Amer >60 >60 mL/min   Anion gap 8 5 - 15     Signed: Lora PaulaJennifer T Marchel Foote, MD 06/03/2015, 2:17 PM    Services Ordered on Discharge: none Equipment Ordered on Discharge: none

## 2015-06-03 NOTE — Discharge Instructions (Signed)
1. New medications:  Atorvastatin  daily for cholesterol Clopidogrel (Plavix)  daily for stroke prevention Hydrochlorothiazide 12.5mg  daily for high blood pressure (start in 1-2 days)  2. Changes in your medications:  You may restart your Lasix and olmesartan as you were taking them.  Stop clonidine Stop prazosin  3. Follow up  Please follow up with your primary care provider in 1-2 weeks. Please follow up with neurology in 2 months.    Ischemic Stroke An ischemic stroke is the sudden death of brain tissue. Blood carries oxygen to all areas of your body. This type of stroke happens when your blood does not flow to your brain like normal. Your brain cannot get the oxygen it needs. This is an emergency. Symptoms of any stroke usually happen all of the sudden. You may notice them when you wake up. They can include sudden:  Weakness or loss of feeling in your face, arm, or leg, especially on one side of your body.  Confusion.  Trouble talking or understanding.  Trouble seeing.  Trouble walking or moving your arms or legs.  Dizziness.  Loss of balance or coordination.  Severe headache with no known cause. Get help as soon as any of these problems start. This is important so your doctor can treat you right away with:   Medicines that dissolve blood clots.  A device to remove a blood clot. A few hours after the start of your symptoms, your doctor may treat you with:  Oxygen.  Medicines.  A surgery to widen blood vessels. RISK FACTORS  Risk factors are things that make it more likely for you to have a stroke. These include:  High blood pressure.  High cholesterol.  Diabetes.  Heart disease.  Having a buildup of fatty deposits in the blood vessels.  Having an abnormal heart rhythm (atrial fibrillation).  Being very overweight (obese).  Smoking.  Taking birth control pills, especially if you smoke.  Not being active.  Having a diet that is high  in fats, salt, and calories.  Drinking too much alcohol.  Using illegal drugs.  Being African American.  Being over the age of 64.  Having a family history of stroke.  Having a history of blood clots, stroke, warning stroke (transient ischemic attack, TIA), or heart attack.  Sickle cell disease. HOME CARE  Take all medicines exactly as told by your doctor. Understand all your medicine instructions.  If you are able to swallow, eat healthy foods. Eat 5 or more servings of fruits and vegetables each day. Eat soft foods or pureed foods, or eat small bites of food so you do not choke.  Stay active. Try to get at least 30 minutes of activity on most or all days.  Do not smoke.  Limit or stop alcohol use.  Keep your home safe so you do not fall. Try:  Putting grab bars in the bedroom and bathroom.  Raising toilet seats.  Putting a seat in the shower.  Go to physical, occupational, and speech therapy sessions as told by your doctor.  Use a walker or a cane at all times if told to do so.  Keep all doctor visits as told. GET HELP RIGHT AWAY IF:   You have sudden weakness or loss of feeling in your face, arm, or leg, especially on one side of your body.  You are suddenly confused.  You have trouble talking or understanding.  You have sudden trouble seeing.  You have sudden trouble walking or moving  your arms or legs.  You have sudden dizziness.  You lose your balance or your movements are not smooth.  You have a sudden, severe headache with no known cause.  You are partly unaware or totally unaware of what is going on around you. Any of these symptoms may be a sign of an emergency. Do not wait to see if the symptoms go away. Call for help (911 in [place] Do not drive yourself to the hospital.   This information is not intended to replace advice given to you by your health care provider. Make sure you discuss any questions you have with your health care provider.     Document Released: 11/04/2013 Document Reviewed: 11/04/2013 Elsevier Interactive Patient Education [employer]2016 [employer]

## 2015-06-03 NOTE — Progress Notes (Signed)
Echocardiogram 2D Echocardiogram has been performed.  Theodore Tanner, Theodore Tanner M 06/03/2015, 10:19 AM

## 2015-06-03 NOTE — Evaluation (Addendum)
Occupational Therapy Evaluation Patient Details Name: Theodore Tanner. MRN: [id number] DOB: [birth year] Today's Date: 06/03/2015    History of Present Illness Patient is a 82 year old male with a past medical history of hypertension, hyperlipidemia, BPH, and polio (right lower extremity weakness) presenting to the hospital with a chief complaint of "seeing double" when he was driving. MRI revealed acute Rt. pontine infarct.   Clinical Impression   Pt admitted with above. Education provided in session. OT signing off.    Follow Up Recommendations  No OT follow up;Supervision/Assistance - 24 hour    Equipment Recommendations  None recommended by OT    Recommendations for Other Services       Precautions / Restrictions Precautions Precautions: Fall Restrictions Weight Bearing Restrictions: No      Mobility Bed Mobility          General bed mobility comments: not assessed  Transfers Overall transfer level: Needs assistance Equipment used: None Transfers: Sit to/from Stand Sit to Stand: Supervision            Balance Unsteady when simulating functional task, while standing, with single leg stance with UE supported-Min guard.                           ADL Overall ADL's : Needs assistance/impaired             Lower Body Bathing: Min guard (standing with UE supported)       Lower Body Dressing: Supervision/safety;Sit to/from stand;Set up   Toilet Transfer: Supervision/safety;Ambulation (cane; ambulated with and without cane) Toilet Transfer Details (indicate cue type and reason): sit to stand from chair     Tub/ Shower Transfer: Tub transfer;Min guard;Ambulation   Functional mobility during ADLs: Supervision/safety;Cane (ambulated with and without cane); Min guard for simulated tub transfer General ADL Comments: Educated on safety such as sitting for LB bathing, safe footwear, recommended someone be with him for tub transfer and for  bathing. Recommended pt use cane.     Vision Pt reported no diplopia in session Vision Assessment?: Yes Visual Fields: No apparent deficits   Perception     Praxis      Pertinent Vitals/Pain Pain Assessment: 0-10 Pain Score: 1  Pain Location: Lt UE when OT performing MMT of left shoulder flexors-reports arthritis Pain Intervention(s): Monitored during session     Hand Dominance Right   Extremity/Trunk Assessment Upper Extremity Assessment Upper Extremity Assessment: Generalized weakness (Lt shoulder flexors weaker than Rt)   Lower Extremity Assessment Lower Extremity Assessment: Defer to PT evaluation RLE Deficits / Details: R quad at 1/5 due to polio from age 42     Communication Communication Communication: No difficulties   Cognition Arousal/Alertness: Awake/alert Behavior During Therapy: WFL for tasks assessed/performed Overall Cognitive Status: Within Functional Limits for tasks assessed                     General Comments       Exercises Exercises: Other exercises Other Exercises Other Exercises: discussed with pt that shoulder flexors felt weak and talked about UE exercise. Pt active at gym and not interested in theraband.   Shoulder Instructions      Home Living Family/patient expects to be discharged to:: Private residence Living Arrangements: Alone Available Help at Discharge: Family (sounded like 24/7 assist available ) Type of Home: House Home Access: Stairs to enter Entrance Stairs-Number of Steps: 6 (in the back, 4 in the front) Entrance Stairs-Rails:  Can reach both Home Layout: One level     Bathroom Shower/Tub: Chief Strategy OfficerTub/shower unit   Bathroom Toilet:  (elevated toilet)     Home Equipment: Cane - single point;Shower seat          Prior Functioning/Environment Level of Independence: Independent        Comments: drives, retired, started using a cane over the last month    OT Diagnosis: Generalized weakness   OT Problem  List:     OT Treatment/Interventions:      OT Goals(Current goals can be found in the care plan section)   OT Frequency:     Barriers to D/C:            Co-evaluation              End of Session Equipment Utilized During Treatment: Other (comment) (cane) Nurse Communication: Other (comment) (saw pt and good to go from OT)  Activity Tolerance: Patient tolerated treatment well Patient left: in chair;with call bell/phone within reach   Time: [id number] OT Time Calculation (min): 18 min Charges:  OT General Charges $OT Visit: 1 Procedure OT Evaluation $OT Eval Moderate Complexity: 1 Procedure G-CodesEarlie Tanner:    Theodore Tanner OTR/Tanner [phone] 06/03/2015, 2:01 PM

## 2015-06-03 NOTE — Progress Notes (Addendum)
Subjective: Doing well this morning. He is able to ambulate to the bathroom without problems. Feels that he is almost at baseline. Denies CP or SOB. Eating/drinking ok.  Objective: Vital signs in last 24 hours: Filed Vitals:   06/02/15 1403 06/02/15 2214 06/03/15 0115 06/03/15 0445  BP: 176/93 209/93 179/108 176/99  Pulse: 48 69 62 61  Temp: 98 F (36.7 C) 97.5 F (36.4 C) 97.8 F (36.6 C) 97.4 F (36.3 C)  TempSrc: Oral Oral Oral Oral  Resp: 20 20 18 18   Weight:      SpO2: 100% 99% 98% 98%   Weight change:   Intake/Output Summary (Last 24 hours) at 06/03/15 [id number] Last data filed at 06/02/15 2126  Gross per 24 hour  Intake      3 ml  Output      0 ml  Net      3 ml   General Apperance: NAD HEENT: Normocephalic, atraumatic, anicteric sclera Neck: Supple, trachea midline Lungs: Clear to auscultation bilaterally. No wheezes, rhonchi or rales. Breathing comfortably Heart: Regular rate and rhythm, no murmur/rub/gallop Abdomen: Soft, nontender, nondistended, no rebound/guarding Extremities: Warm and well perfused, no edema Skin: No rashes or lesions Neurologic: Alert and interactive. No gross deficits.  Lab Results: Basic Metabolic Panel:  Recent Labs Lab 06/02/15 0445 06/03/15 0240  NA 144 140  K 3.4* 3.5  CL 106 104  CO2 28 28  GLUCOSE 101* 126*  BUN 13 15  CREATININE 1.02 1.13  CALCIUM 8.7* 8.7*   CBC:  Recent Labs Lab 06/01/15 0936  WBC 5.6  NEUTROABS 4.4  HGB 14.5  HCT 41.9  MCV 90.1  PLT 164   Cardiac Enzymes:  Recent Labs Lab 06/01/15 0936  TROPONINI <0.03   CBG:  Recent Labs Lab 06/01/15 0927  GLUCAP 135*   Fasting Lipid Panel:  Recent Labs Lab 06/01/15 2104  CHOL 176  HDL 67  LDLCALC 95  TRIG 72  CHOLHDL 2.6    Alcohol Level:  Recent Labs Lab 06/01/15 0927  ETH <5   Urinalysis:  Recent Labs Lab 06/01/15 2122  COLORURINE YELLOW  LABSPEC 1.016  PHURINE 7.5  GLUCOSEU NEGATIVE  HGBUR NEGATIVE  BILIRUBINUR  NEGATIVE  KETONESUR NEGATIVE  PROTEINUR NEGATIVE  NITRITE NEGATIVE  LEUKOCYTESUR NEGATIVE   Studies/Results: Ct Angio Head W/cm &/or Wo Cm  06/01/2015  CLINICAL DATA:  Stroke.  History of hypertension and hyperlipidemia. EXAM: CT ANGIOGRAPHY HEAD AND NECK TECHNIQUE: Multidetector CT imaging of the head and neck was performed using the standard protocol during bolus administration of intravenous contrast. Multiplanar CT image reconstructions and MIPs were obtained to evaluate the vascular anatomy. Carotid stenosis measurements (when applicable) are obtained utilizing NASCET criteria, using the distal internal carotid diameter as the denominator. CONTRAST:  50 cc Isovue 370 COMPARISON:  MRI of the brain June 01, 2015 at 1424 hours FINDINGS: CT HEAD INTRACRANIAL CONTENTS: The ventricles and sulci are normal for age. No intraparenchymal hemorrhage, mass effect nor midline shift. Patchy supratentorial white matter hypodensities are less than expected for patient's age and though non-specific likely represent chronic small vessel ischemic disease. No abnormal intracranial enhancement. No acute large vascular territory infarcts. No abnormal extra-axial fluid collections. Basal cisterns are patent. Mild calcific atherosclerosis of the carotid siphons. ORBITS: The included ocular globes and orbital contents are non-suspicious. SINUSES: The mastoid aircells and included paranasal sinuses are well-aerated. SKULL/SOFT TISSUES: No skull fracture. No significant soft tissue swelling. CTA NECK Aortic arch: Normal appearance of the thoracic arch,  normal branch pattern. Mild calcific atherosclerosis of the aortic arch. The origins of the innominate, left Common carotid artery and subclavian artery are widely patent. Right carotid system: Common carotid artery is widely patent, coursing in a straight line fashion. Normal appearance of the carotid bifurcation without hemodynamically significant stenosis by NASCET criteria.  Minimal eccentric calcific atherosclerosis. Normal appearance of the included internal carotid artery. Left carotid system: Common carotid artery is widely patent, coursing in a straight line fashion. Normal appearance of the carotid bifurcation without hemodynamically significant stenosis by NASCET criteria. Minimal eccentric calcific atherosclerosis. Normal appearance of the included internal carotid artery. Vertebral arteries:Left vertebral artery is dominant. Mild extrinsic deformity due to degenerative cervical spine. Patent vertebral arteries. Skeleton: No acute osseous process though bone windows have not been submitted. Multilevel moderate to severe degenerative discs and facet arthropathy with resultant mild canal stenosis C3-4, C4-5, C5-6 and C6-7. Severe LEFT C3-4, bilateral C4-5, RIGHT greater than LEFT C5-6 neural foraminal narrowing. Other neck: Soft tissues of the neck are non-acute though, not tailored for evaluation. Mild apical bullous changes on the RIGHT. Calcified subcarinal lymph nodes. Heterogeneous RIGHT thyroid lobe without dominant nodule, status post LEFT thyroidectomy. Mildly atrophic RIGHT submandibular gland. CTA HEAD Anterior circulation: Normal appearance of the cervical internal carotid arteries, petrous, cavernous and supra clinoid internal carotid arteries. Widely patent anterior communicating artery. Moderate stenosis proximal RIGHT M2 segment. Otherwise normal appearance of the anterior and middle cerebral arteries. Posterior circulation: Normal appearance of the vertebral arteries, vertebrobasilar junction and basilar artery, as well as main branch vessels. Robust bilateral posterior communicating arteries contribute to posterior circulation. Moderate stenosis proximal LEFT P2 segment. Mild luminal irregularity of the intracranial vessels without acute large vessel occlusion, scratch or aneurysm. 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 IMPRESSION: CT HEAD:  Negative CT  head with and without contrast for age. CTA NECK: Atherosclerosis without hemodynamically significant stenosis or acute vascular process. CTA HEAD: No emergent large vessel occlusion. 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 Moderate stenosis proximal RIGHT M2 segment and proximal LEFT P2 segment. Mild luminal irregularity of the intracranial vessels compatible with atherosclerosis. Electronically Signed   By: Awilda Metro M.D.   On: 06/01/2015 23:56   Ct Head Wo Contrast  06/01/2015  CLINICAL DATA:  Diplopia and nausea since this morning. EXAM: CT HEAD WITHOUT CONTRAST TECHNIQUE: Contiguous axial images were obtained from the base of the skull through the vertex without intravenous contrast. COMPARISON:  None. FINDINGS: No mass lesion. No midline shift. No acute hemorrhage or hematoma. No extra-axial fluid collections. No evidence of acute infarction. Diffuse slight cerebellar atrophy and cerebral cortical atrophy. Brain parenchyma is otherwise normal. No bone abnormality. Visualized portion of the orbits is normal. IMPRESSION: No acute abnormality.  Atrophy as described. Electronically Signed   By: Francene Boyers M.D.   On: 06/01/2015 10:31   Ct Angio Neck W/cm &/or Wo/cm  06/01/2015  CLINICAL DATA:  Stroke.  History of hypertension and hyperlipidemia. EXAM: CT ANGIOGRAPHY HEAD AND NECK TECHNIQUE: Multidetector CT imaging of the head and neck was performed using the standard protocol during bolus administration of intravenous contrast. Multiplanar CT image reconstructions and MIPs were obtained to evaluate the vascular anatomy. Carotid stenosis measurements (when applicable) are obtained utilizing NASCET criteria, using the distal internal carotid diameter as the denominator. CONTRAST:  50 cc Isovue 370 COMPARISON:  MRI of the brain June 01, 2015 at 1424 hours FINDINGS: CT HEAD INTRACRANIAL CONTENTS: The ventricles and sulci are normal for age.  No intraparenchymal hemorrhage, mass effect nor  midline shift. Patchy supratentorial white matter hypodensities are less than expected for patient's age and though non-specific likely represent chronic small vessel ischemic disease. No abnormal intracranial enhancement. No acute large vascular territory infarcts. No abnormal extra-axial fluid collections. Basal cisterns are patent. Mild calcific atherosclerosis of the carotid siphons. ORBITS: The included ocular globes and orbital contents are non-suspicious. SINUSES: The mastoid aircells and included paranasal sinuses are well-aerated. SKULL/SOFT TISSUES: No skull fracture. No significant soft tissue swelling. CTA NECK Aortic arch: Normal appearance of the thoracic arch, normal branch pattern. Mild calcific atherosclerosis of the aortic arch. The origins of the innominate, left Common carotid artery and subclavian artery are widely patent. Right carotid system: Common carotid artery is widely patent, coursing in a straight line fashion. Normal appearance of the carotid bifurcation without hemodynamically significant stenosis by NASCET criteria. Minimal eccentric calcific atherosclerosis. Normal appearance of the included internal carotid artery. Left carotid system: Common carotid artery is widely patent, coursing in a straight line fashion. Normal appearance of the carotid bifurcation without hemodynamically significant stenosis by NASCET criteria. Minimal eccentric calcific atherosclerosis. Normal appearance of the included internal carotid artery. Vertebral arteries:Left vertebral artery is dominant. Mild extrinsic deformity due to degenerative cervical spine. Patent vertebral arteries. Skeleton: No acute osseous process though bone windows have not been submitted. Multilevel moderate to severe degenerative discs and facet arthropathy with resultant mild canal stenosis C3-4, C4-5, C5-6 and C6-7. Severe LEFT C3-4, bilateral C4-5, RIGHT greater than LEFT C5-6 neural foraminal narrowing. Other neck: Soft tissues  of the neck are non-acute though, not tailored for evaluation. Mild apical bullous changes on the RIGHT. Calcified subcarinal lymph nodes. Heterogeneous RIGHT thyroid lobe without dominant nodule, status post LEFT thyroidectomy. Mildly atrophic RIGHT submandibular gland. CTA HEAD Anterior circulation: Normal appearance of the cervical internal carotid arteries, petrous, cavernous and supra clinoid internal carotid arteries. Widely patent anterior communicating artery. Moderate stenosis proximal RIGHT M2 segment. Otherwise normal appearance of the anterior and middle cerebral arteries. Posterior circulation: Normal appearance of the vertebral arteries, vertebrobasilar junction and basilar artery, as well as main branch vessels. Robust bilateral posterior communicating arteries contribute to posterior circulation. Moderate stenosis proximal LEFT P2 segment. Mild luminal irregularity of the intracranial vessels without acute large vessel occlusion, scratch or aneurysm. 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 IMPRESSION: CT HEAD:  Negative CT head with and without contrast for age. CTA NECK: Atherosclerosis without hemodynamically significant stenosis or acute vascular process. CTA HEAD: No emergent large vessel occlusion. 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 Moderate stenosis proximal RIGHT M2 segment and proximal LEFT P2 segment. Mild luminal irregularity of the intracranial vessels compatible with atherosclerosis. Electronically Signed   By: Awilda Metro M.D.   On: 06/01/2015 23:56   Mr Brain Wo Contrast  06/01/2015  CLINICAL DATA:  Diplopia and nausea beginning this morning. EXAM: MRI HEAD WITHOUT CONTRAST TECHNIQUE: Multiplanar, multiecho pulse sequences of the brain and surrounding structures were obtained without intravenous contrast. COMPARISON:  Head CT 06/01/2015 FINDINGS: There is a punctate 3 mm acute infarct in the posterior right pons. There is no evidence of intracranial  hemorrhage, mass, midline shift, or extra-axial fluid collection. There is mild cerebral and cerebellar atrophy. T2 hyperintensities in the subcortical and periventricular cerebral white matter are compatible with mild chronic small vessel ischemic disease. There is a chronic left central pontine infarct. Orbits are unremarkable. A right maxillary sinus mucous retention cyst is noted. The mastoid air cells are clear.  Major intracranial vascular flow voids are preserved. IMPRESSION: 1. Punctate acute right pontine infarct. 2. Chronic left pontine infarct and chronic small vessel ischemic change in the cerebral white matter. Electronically Signed   By: Sebastian Ache M.D.   On: 06/01/2015 15:09   Medications: I have reviewed the patient's current medications. Scheduled Meds: . atorvastatin  80 mg Oral q1800  . clopidogrel  75 mg Oral Daily  . enoxaparin (LOVENOX) injection  40 mg Subcutaneous Q24H  . HYDROcodone-acetaminophen  2 tablet Oral Once  . sodium chloride flush  3 mL Intravenous Q12H   Continuous Infusions:  PRN Meds:.HYDROcodone-acetaminophen Assessment/Plan: 82 year old man with history of of Polio with residual RLE weakness, HTN, HLD presenting with diplopia and dizziness x 1 day. MRI with acute R pontine infarct.  Acute right pontine infarct: CTA of head with tandem severe stenosis distal LEFT V4 segment. Moderate stenosis proximal RIGHT M2 segment and proximal LEFT P2 segment. Mild luminal irregularity of the intracranial vessels compatible with atherosclerosis. CTA of neck with atherosclerosis but no significant stenosis or acute vascular process. Lipid panel with cholesterol 176, HDL 67, and LDL 95.  -Neurology following, appreciate recommendations -Monitor on telemetry  -Plavix 75 mg daily  -Lipitor 80 mg daily  -Hgb A1c pending -Echo pending -PT/OT pending   AKI: Resolved  Hypokalemia: Resolved  HTN -Allow permissive HTN (<220/120) -Plan is to discontinue his home  medication Clonidine to prevent rebound HTN. He will be started on HCTZ prior to discharge.   VTE ppx: Lovenox   Dispo: Disposition is deferred at this time, awaiting improvement of current medical problems.  Anticipated discharge in approximately 0-1 day(s).   The patient does have a current PCP (Pcp Not In System) and does not need an [facility] hospital follow-up appointment after discharge.  The patient does not know have transportation limitations that hinder transportation to clinic appointments.  .Services Needed at time of discharge: Y = Yes, Blank = No PT:   OT:   RN:   Equipment:   Other:     LOS: 2 days   Lora Paula, MD 06/03/2015, 8:26 AM

## 2015-06-03 NOTE — Evaluation (Signed)
Physical Therapy Evaluation Patient Details Name: Theodore NunneryFrancis K Berndt Jr. MRN: [id number] DOB: [birth year] Today's Date: 06/03/2015   History of Present Illness  Patient is a 82 year old male with a past medical history of hypertension, hyperlipidemia, BPH, and polio (right lower extremity weakness) presenting to the hospital with a chief complaint of "seeing double" when he was driving earlier today.  Clinical Impression  Pt admitted with above diagnosis. Pt currently with functional limitations due to the deficits listed below (see PT Problem List). Pt functioning at baseline. Pt educated on proper sequencing with cane in L hand to assist with R LE weakness. Pt will benefit from skilled PT to increase their independence and safety with mobility to allow discharge to the venue listed below.       Follow Up Recommendations No PT follow up;Supervision/Assistance - 24 hour (initially for a few days)    Equipment Recommendations  None recommended by PT    Recommendations for Other Services       Precautions / Restrictions Precautions Precautions: Fall Restrictions Weight Bearing Restrictions: No      Mobility  Bed Mobility Overal bed mobility: Modified Independent             General bed mobility comments: used bed rail  Transfers Overall transfer level: Needs assistance Equipment used: None Transfers: Sit to/from Stand Sit to Stand: Supervision         General transfer comment: pt used hands to push up from bed, not episodes of LOB  Ambulation/Gait Ambulation/Gait assistance: Supervision Ambulation Distance (Feet): 150 Feet Assistive device: Straight cane;None Gait Pattern/deviations: Wide base of support Gait velocity: wfl Gait velocity interpretation: at or above normal speed for age/gender General Gait Details: inititally pt amb without cane, pt with external rotation of R LE and decreased step height triping over foot, pt agreed to use cane, pt stabiltiy much  improved. worked on proper sequencing of cane, pt return demonstrated  Stairs Stairs: Yes Stairs assistance: Min guard Stair Management: Two rails;Step to pattern Number of Stairs: 3 General stair comments: pt unable to do reciprocally  Wheelchair Mobility    Modified Rankin (Stroke Patients Only)       Balance Overall balance assessment: Needs assistance         Standing balance support: No upper extremity supported Standing balance-Leahy Scale: Fair Standing balance comment: pt stood at sink to wash hands without difficulty                             Pertinent Vitals/Pain Pain Assessment: No/denies pain    Home Living Family/patient expects to be discharged to:: Private residence Living Arrangements: Alone Available Help at Discharge: Family;Available PRN/intermittently Type of Home: House Home Access: Stairs to enter Entrance Stairs-Rails: Can reach both Entrance Stairs-Number of Steps: 6 (in the back, 4 in the front) Home Layout: One level Home Equipment: Cane - single point      Prior Function Level of Independence: Independent         Comments: drives, retired, started using a cane over the last month     Hand Dominance   Dominant Hand: Right    Extremity/Trunk Assessment   Upper Extremity Assessment: Overall WFL for tasks assessed           Lower Extremity Assessment: RLE deficits/detail RLE Deficits / Details: R quad at 1/5 due to polio from age 89    Cervical / Trunk Assessment: Normal  Communication   Communication:  No difficulties  Cognition Arousal/Alertness: Awake/alert Behavior During Therapy: WFL for tasks assessed/performed Overall Cognitive Status: Within Functional Limits for tasks assessed                      General Comments      Exercises        Assessment/Plan    PT Assessment Patient needs continued PT services  PT Diagnosis Difficulty walking   PT Problem List Decreased  strength;Decreased activity tolerance;Decreased balance;Decreased mobility  PT Treatment Interventions DME instruction;Gait training;Stair training;Functional mobility training;Therapeutic activities;Therapeutic exercise;Balance training   PT Goals (Current goals can be found in the Care Plan section) Acute Rehab PT Goals Patient Stated Goal: home asap PT Goal Formulation: With patient Time For Goal Achievement: 06/10/15 Potential to Achieve Goals: Good Additional Goals Additional Goal #1: Pt to demo proper cane sequencing with R LE with cane in L UE.    Frequency Min 2X/week   Barriers to discharge Decreased caregiver support lives alone, dtr out of town    Co-evaluation               End of Session Equipment Utilized During Treatment: Gait belt Activity Tolerance: Patient tolerated treatment well Patient left: in chair;with call bell/phone within reach Nurse Communication: Mobility status         Time: 1128-1150 PT Time Calculation (min) (ACUTE ONLY): 22 min   Charges:   PT Evaluation $PT Eval Low Complexity: 1 Procedure     PT G CodesMarcene Brawn 06/03/2015, 12:42 PM   Lewis Shock, PT, DPT Pager #: [phone] Office #: [phone]

## 2015-06-03 NOTE — Progress Notes (Signed)
Discharge orders received, Pt for discharge home today. IV d/c'd. D/c instructions and RX given with verbalized understanding. Family at bedside to assist patient with discharge. Staff bought pt downstairs via wheelchair. 05/24/15 1530

## 2015-06-04 LAB — HEMOGLOBIN A1C
Hgb A1c MFr Bld: 6.2 % — ABNORMAL HIGH (ref 4.8–5.6)
MEAN PLASMA GLUCOSE: 131 mg/dL

## 2015-06-15 ENCOUNTER — Other Ambulatory Visit: Payer: Self-pay | Admitting: *Deleted

## 2015-06-15 NOTE — Patient Outreach (Addendum)
[facility] Care Management  06/15/2015  Theodore NunneryFrancis K Thelander Jr. [birth year] [id number]  Subjective:  Telephone call to patient's home number, no answer, left HIPAA compliant voicemail message, and requested call back. Telephone call from patient and HIPAA verified.   D[facility]ed [facility] EMMI Stroke follow up program and patient in agreement to complete follow up.   Patient states he is doing well and had a few questions regarding his medications.  Patient in agreement to a [facility] for medication review and questions regarding medications.   Patient states he has a question regarding time of day that he takes Plavix and if he can continue taking some herbal supplement.  He also has questions about black seed oil.    Patient states he drove himself for the first time on 06/14/15 to his primary MD office appointment, since he got out of the hospital and it went well.   States his blood pressure was 120 /80 and he feels great.   Patient also wanted to know when [facility] can [facility].   RNCM advised patient to follow up with neurologist office regarding exercise.  Patient also states he would like to see a neurologist (Dr.Tabet) closer to home for future neurology follow up instead of [place] [facility] [place], since [place] in [place].   RN advised patient to [place]  [place] and ask for feedback on when it would be best to transition to neurologist in [place].   Patient voiced understanding, states he will [facility] regarding [place] to neurologist [place] and when to resume exercise.   Patient states he does not have any telephonic RNCM, care coordination, disease management, or stroke follow up needs at this time.  Patient states he will call RNCM if he has any further questions, patient in agreement to continue to receive EMMI Stroke automated calls and follow  up as needed.  Tele[facility] to [facility] Pharmacist (Dawn Pettus and Tommye StandardKevin Ruedinger), message states currently out of the office and no message left. [facility] Lilla Shook(Rachel Henderson) and left HIPAA compliant voicemail message requesting call back.  Telephone to patient's home number, spoke with patient, and HIPAA verified.   RNCM advised patient [facility] Pharmacist currently not available and will call him next week.  Patient states to not [facility] and he will call his local pharmacy ([facility]) today regarding is questions.    Patient states he is very appreciative of the call back.   Objective: Per chart review: Patient hospitalized 06/01/15 - 06/03/15 for stroke.   Patient also has a history of hypertension.    Assessment: Received EMMI Stroke Red flag referral on 06/15/15.   Flag trigger date: 06/14/15.   Day # 9.  Trigger: Patient answered yes to questions / problems with meds.   EMMI Stroke follow up completed and no further telephonic RNCM needs at this time.     Plan: RNCM will send case closure due to program completion request to Sherle PoeNicole Robinson at [facility].    Belladonna Lubinski H. Gardiner Barefootooper RN, BSN, CCM [facility] Care Management [facility] Telephonic CM Phone: [phone] Fax: [phone]

## 2015-06-18 ENCOUNTER — Ambulatory Visit: Payer: Medicare Other | Admitting: *Deleted

## 2015-07-01 ENCOUNTER — Other Ambulatory Visit: Payer: Self-pay | Admitting: Pulmonary Disease

## 2015-07-06 ENCOUNTER — Other Ambulatory Visit: Payer: Self-pay | Admitting: Pulmonary Disease

## 2015-10-23 ENCOUNTER — Other Ambulatory Visit: Payer: Self-pay | Admitting: Pulmonary Disease

## 2016-01-03 ENCOUNTER — Other Ambulatory Visit: Payer: Self-pay | Admitting: Pulmonary Disease

## 2016-01-29 ENCOUNTER — Other Ambulatory Visit: Payer: Self-pay | Admitting: Pulmonary Disease

## 2016-05-09 ENCOUNTER — Other Ambulatory Visit: Payer: Self-pay | Admitting: Pulmonary Disease

## 2016-06-17 ENCOUNTER — Other Ambulatory Visit: Payer: Self-pay | Admitting: Pulmonary Disease

## 2017-11-06 IMAGING — CT CT ANGIO HEAD
1 of 12 series · 1 of 33 positions shown · IV contrast (isovue)
Comparison: MRI of the brain June 01, 2015 at 2353 hours

CLINICAL DATA: Stroke.  History of hypertension and hyperlipidemia.

EXAM:
CT ANGIOGRAPHY HEAD AND NECK
TECHNIQUE: Multidetector CT imaging of the head and neck was performed using
the standard protocol during bolus administration of intravenous
contrast. Multiplanar CT image reconstructions and MIPs were
obtained to evaluate the vascular anatomy. Carotid stenosis
measurements (when applicable) are obtained utilizing NASCET
criteria, using the distal internal carotid diameter as the
denominator.
CONTRAST:  50 cc Isovue 370

[Series 300: locator · axial · 0.56mm/px · 1 of 1 slices shown]
[im 1/1  soft-tissue]
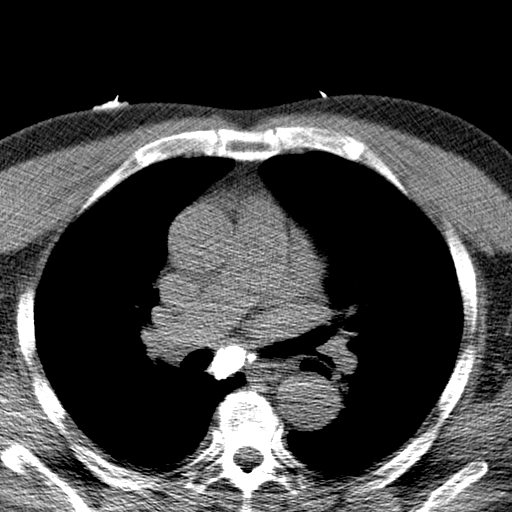

[1 of 33 positions shown; findings below may reference images not displayed]

FINDINGS: CT HEAD

INTRACRANIAL CONTENTS: The ventricles and sulci are normal for age.
No intraparenchymal hemorrhage, mass effect nor midline shift.
Patchy supratentorial white matter hypodensities are less than
expected for patient's age and though non-specific likely represent
chronic small vessel ischemic disease. No abnormal intracranial
enhancement. No acute large vascular territory infarcts. No abnormal
extra-axial fluid collections. Basal cisterns are patent. Mild
calcific atherosclerosis of the carotid siphons.

ORBITS: The included ocular globes and orbital contents are
non-suspicious.

SINUSES: The mastoid aircells and included paranasal sinuses are
well-aerated.

SKULL/SOFT TISSUES: No skull fracture. No significant soft tissue
swelling.

CTA NECK

Aortic arch: Normal appearance of the thoracic arch, normal branch
pattern. Mild calcific atherosclerosis of the aortic arch. The
origins of the innominate, left Common carotid artery and subclavian
artery are widely patent.

Right carotid system: Common carotid artery is widely patent,
coursing in a straight line fashion. Normal appearance of the
carotid bifurcation without hemodynamically significant stenosis by
NASCET criteria. Minimal eccentric calcific atherosclerosis. Normal
appearance of the included internal carotid artery.

Left carotid system: Common carotid artery is widely patent,
coursing in a straight line fashion. Normal appearance of the
carotid bifurcation without hemodynamically significant stenosis by
NASCET criteria. Minimal eccentric calcific atherosclerosis. Normal
appearance of the included internal carotid artery.

Vertebral arteries:Left vertebral artery is dominant. Mild extrinsic
deformity due to degenerative cervical spine. Patent vertebral
arteries.

Skeleton: No acute osseous process though bone windows have not been
submitted. Multilevel moderate to severe degenerative discs and
facet arthropathy with resultant mild canal stenosis C3-4, C4-5,
C5-6 and C6-7. Severe LEFT C3-4, bilateral C4-5, RIGHT greater than
LEFT C5-6 neural foraminal narrowing.

Other neck: Soft tissues of the neck are non-acute though, not
tailored for evaluation. Mild apical bullous changes on the RIGHT.
Calcified subcarinal lymph nodes. Heterogeneous RIGHT thyroid lobe
without dominant nodule, status post LEFT thyroidectomy. Mildly
atrophic RIGHT submandibular gland.

CTA HEAD

Anterior circulation: Normal appearance of the cervical internal
carotid arteries, petrous, cavernous and supra clinoid internal
carotid arteries. Widely patent anterior communicating artery.
Moderate stenosis proximal RIGHT M2 segment. Otherwise normal
appearance of the anterior and middle cerebral arteries.

Posterior circulation: Normal appearance of the vertebral arteries,
vertebrobasilar junction and basilar artery, as well as main branch
vessels. Robust bilateral posterior communicating arteries
contribute to posterior circulation. Moderate stenosis proximal LEFT
P2 segment.

Mild luminal irregularity of the intracranial vessels without acute
large vessel occlusion, scratch or aneurysm. Tandem severe stenosis
distal LEFT V4 segment.
IMPRESSION: CT HEAD:  Negative CT head with and without contrast for age.

CTA NECK: Atherosclerosis without hemodynamically significant
stenosis or acute vascular process.

CTA HEAD: No emergent large vessel occlusion. Tandem severe stenosis
distal LEFT V4 segment.

Moderate stenosis proximal RIGHT M2 segment and proximal LEFT P2
segment. Mild luminal irregularity of the intracranial vessels
compatible with atherosclerosis.

## 2017-11-06 IMAGING — MR MR HEAD W/O CM
9 of 11 series · 34 of 48 positions shown · non-contrast
Comparison: Head CT 06/01/2015

CLINICAL DATA: Diplopia and nausea beginning this morning.

EXAM:
MRI HEAD WITHOUT CONTRAST
TECHNIQUE: Multiplanar, multiecho pulse sequences of the brain and surrounding
structures were obtained without intravenous contrast.

[Series 2: FLAIR · sagittal · 5.0mm · 0.47mm/px · 3 of 23 slices shown (1 of 2)]
[im 1/23]
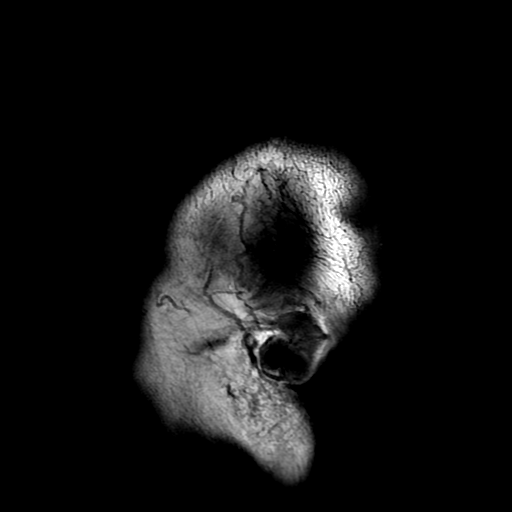
[im 12/23]
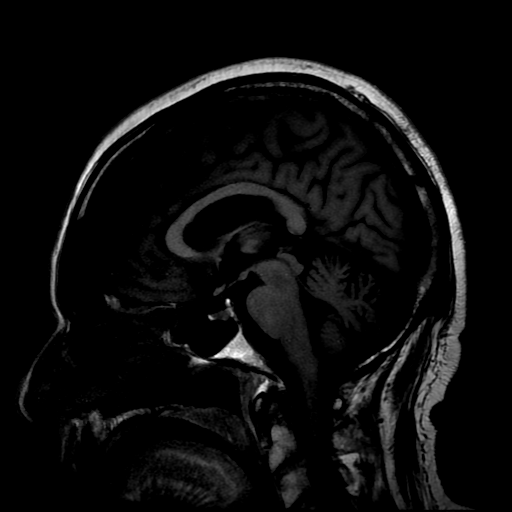
[im 23/23]
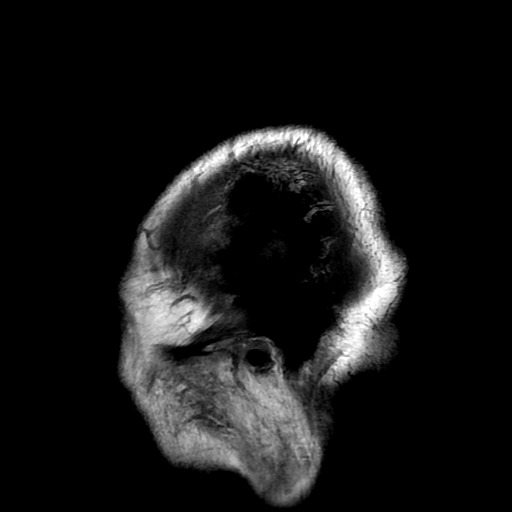

[Series 4: DWI · axial · 3.6mm · 0.94mm/px · z∈[-135,+9]mm · 9 of 86 slices shown (1 of 4)]
[im 1/86]
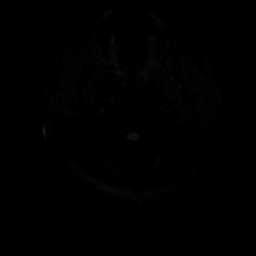
[im 11/86]
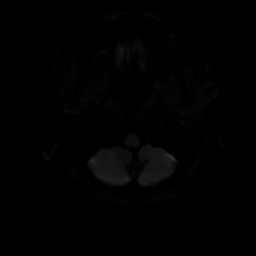
[im 22/86]
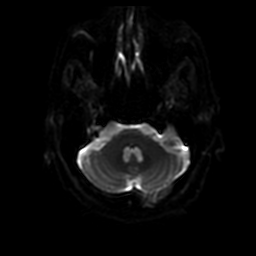
[im 32/86]
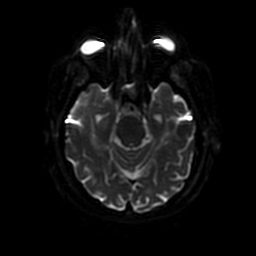
[im 43/86]
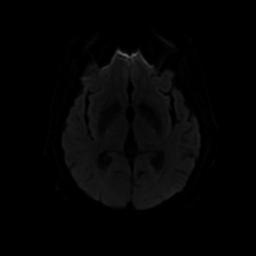
[im 54/86]
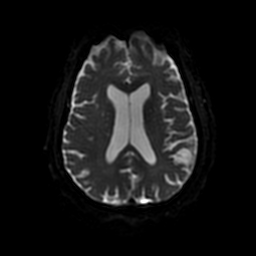
[im 64/86]
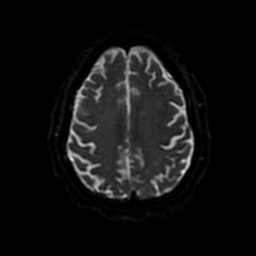
[im 75/86]
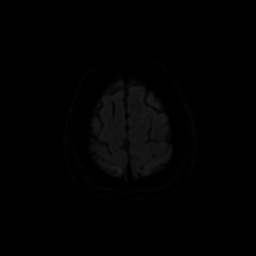
[im 86/86]
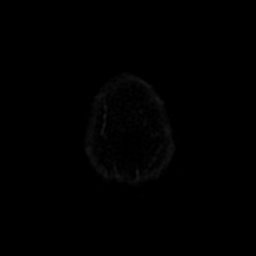

[Series 5: T2 · axial · 5.0mm · 0.47mm/px · z∈[-138,+6]mm · 2 of 26 slices shown]
[im 1/26]
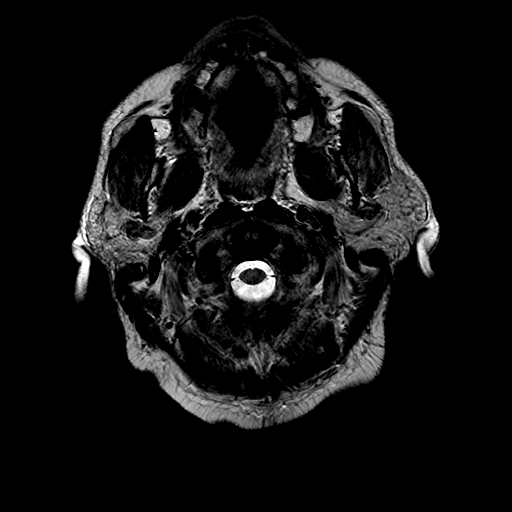
[im 26/26]
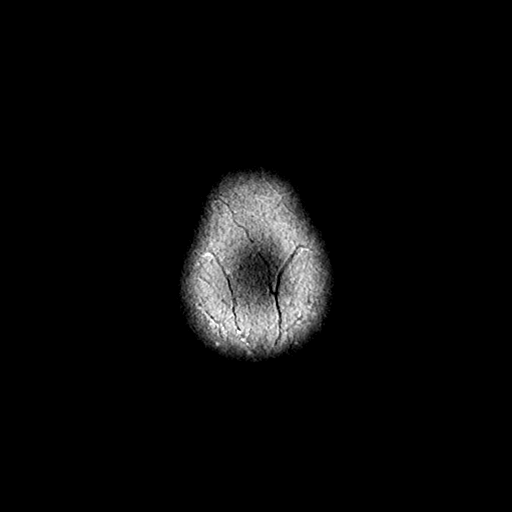

[Series 6: FLAIR · axial · 5.0mm · 0.47mm/px · z∈[-138,+6]mm · 2 of 26 slices shown (2 of 2)]
[im 1/26]
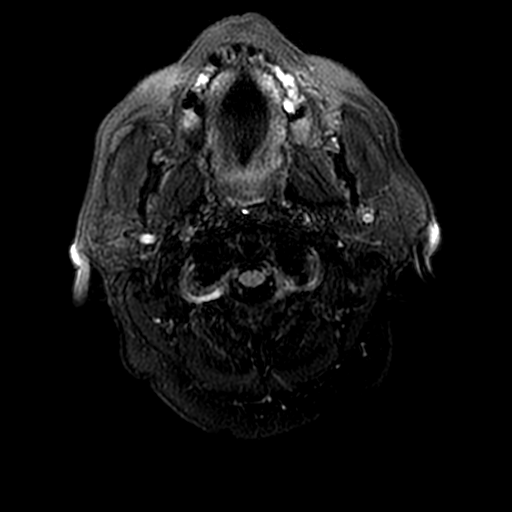
[im 26/26]
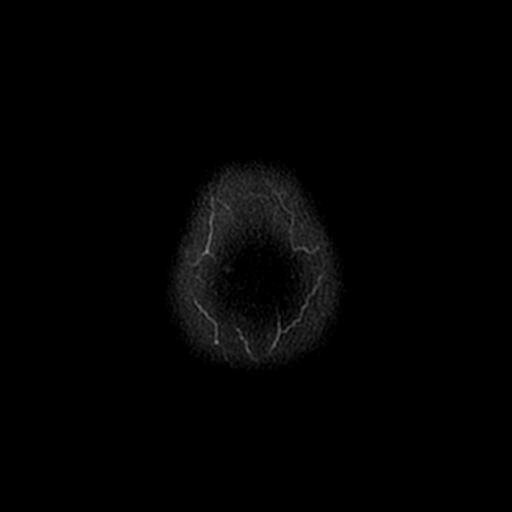

[Series 7: DWI · coronal · 5.0mm · 0.94mm/px · 6 of 68 slices shown (2 of 4)]
[im 1/68]
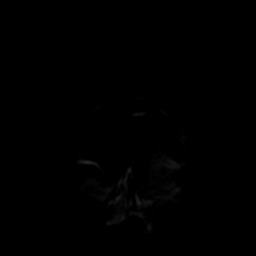
[im 14/68]
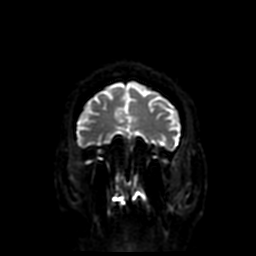
[im 27/68]
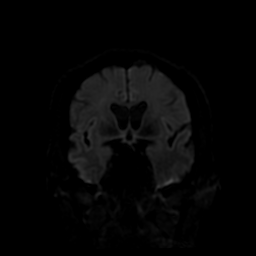
[im 41/68]
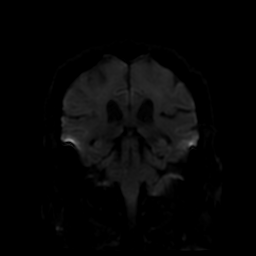
[im 54/68]
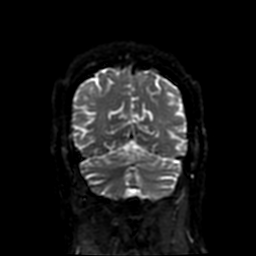
[im 68/68]
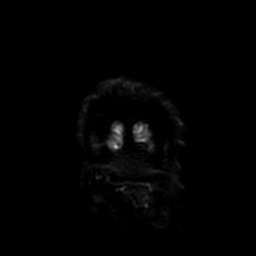

[Series 8: (person_name) · axial · 3.0mm · 0.47mm/px · z∈[-104,-86]mm · 2 of 100 slices shown]
[im 1/100]
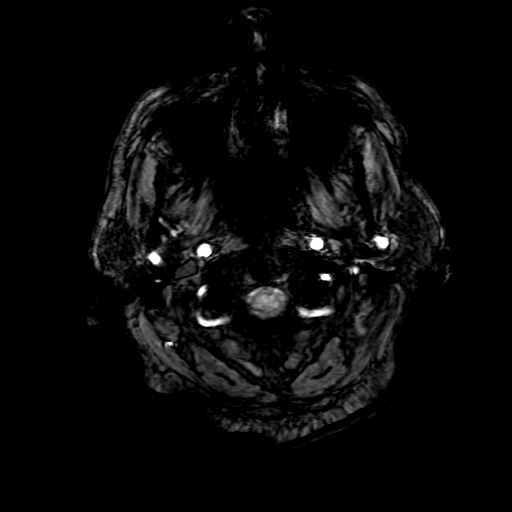
[im 13/100]
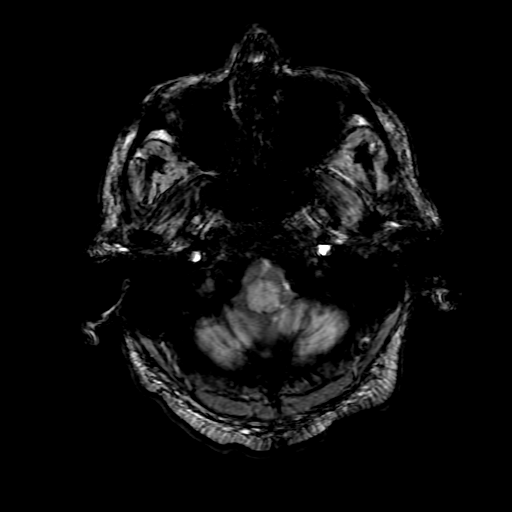

[Series 10: T2 post-contrast · coronal · 5.0mm · 0.39mm/px · 3 of 29 slices shown]
[im 1/29]
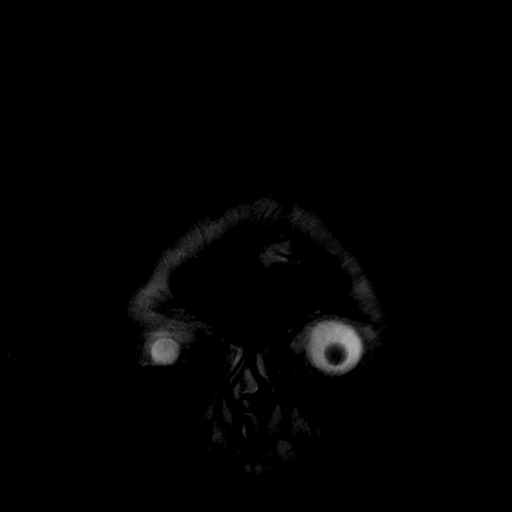
[im 15/29]
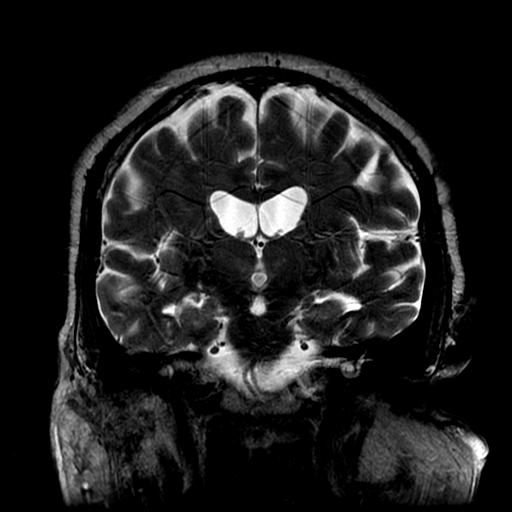
[im 29/29]
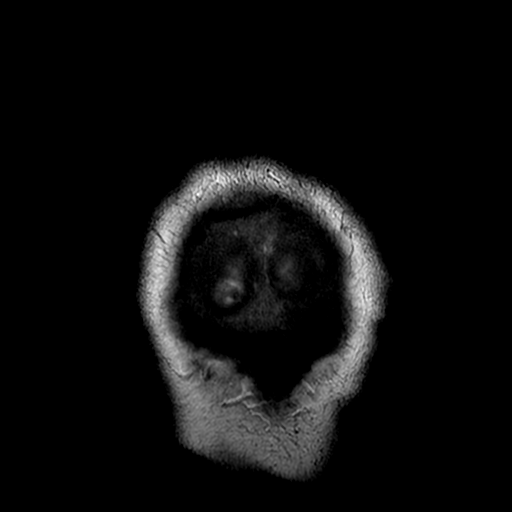

[Series 400: DWI · axial · 3.6mm · 0.94mm/px · z∈[-135,+9]mm · 4 of 43 slices shown (3 of 4)]
[im 1/43]
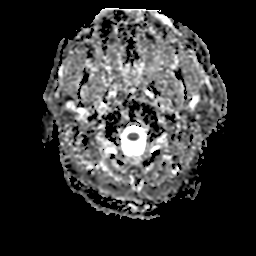
[im 15/43]
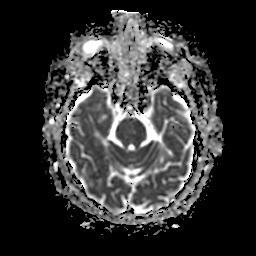
[im 29/43]
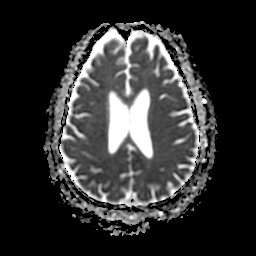
[im 43/43]
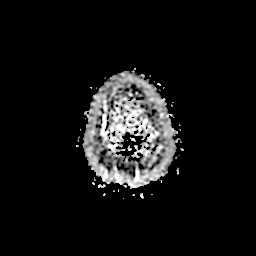

[Series 700: DWI · coronal · 5.0mm · 0.94mm/px · 3 of 33 slices shown (4 of 4)]
[im 1/33]
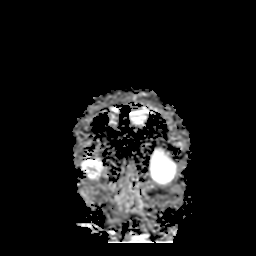
[im 17/33]
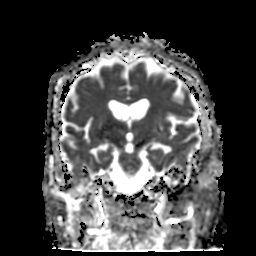
[im 33/33]
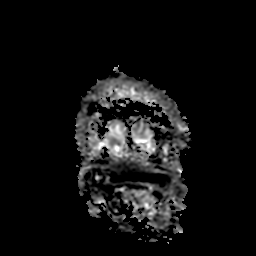

[34 of 48 positions shown; findings below may reference images not displayed]

FINDINGS: There is a punctate 3 mm acute infarct in the posterior right pons.
There is no evidence of intracranial hemorrhage, mass, midline
shift, or extra-axial fluid collection. There is mild cerebral and
cerebellar atrophy. T2 hyperintensities in the subcortical and
periventricular cerebral white matter are compatible with mild
chronic small vessel ischemic disease. There is a chronic left
central pontine infarct.

Orbits are unremarkable. A right maxillary sinus mucous retention
cyst is noted. The mastoid air cells are clear. Major intracranial
vascular flow voids are preserved.
IMPRESSION: 1. Punctate acute right pontine infarct.
2. Chronic left pontine infarct and chronic small vessel ischemic
change in the cerebral white matter.
